# Patient Record
Sex: Female | Born: 1985 | Hispanic: Yes | Marital: Married | Smoking: Never smoker
Health system: Southern US, Community
[De-identification: ages and names within clinical notes are randomized; demographics above are authoritative.]

## PROBLEM LIST (undated history)

## (undated) DIAGNOSIS — D509 Iron deficiency anemia, unspecified: Secondary | ICD-10-CM

## (undated) HISTORY — PX: NO PAST SURGERIES: SHX2092

---
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
## 2006-08-10 ENCOUNTER — Ambulatory Visit: Payer: Self-pay | Admitting: Gastroenterology

## 2007-03-01 ENCOUNTER — Ambulatory Visit: Payer: Self-pay | Admitting: Family Medicine

## 2007-08-09 ENCOUNTER — Observation Stay: Payer: Self-pay

## 2007-08-10 ENCOUNTER — Inpatient Hospital Stay: Payer: Self-pay

## 2007-10-19 ENCOUNTER — Emergency Department: Payer: Self-pay | Admitting: Emergency Medicine

## 2010-01-07 ENCOUNTER — Emergency Department: Payer: Self-pay | Admitting: Unknown Physician Specialty
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
## 2017-11-10 ENCOUNTER — Ambulatory Visit: Payer: Self-pay | Admitting: Family Medicine

## 2018-06-01 NOTE — L&D Delivery Note (Signed)
Delivery Note  Date of delivery: 06/01/2019 Estimated Date of Delivery: 06/05/19 Patient's last menstrual period was 06/28/2018 (exact date). EGA: [redacted]w[redacted]d  Delivery Note At 10:16 AM a viable female was delivered via Vaginal, Spontaneous (Presentation: OA to ROA).  APGAR: 8,9 ; weight 7lbs 7.2oz .   Placenta status: Spontaneous, Intact.  Cord: 3 vessels with the following complications: Velamentous cord insertion.  Cord pH: N/A  Anesthesia: None Episiotomy: None Lacerations: None Suture Repair: None Est. Blood Loss (mL):   50  Mom to postpartum.  Baby to Couplet care / Skin to Skin.  Minda Meo 06/01/2019, 10:34 AM  First Stage:  Labor onset: 0800 Augmentation : AROM Analgesia Lorriane Shire intrapartum: IV medication AROM at [facility] presented to L&D with regular contractions. She was expectantly managed. IV medications were given for pain relief.   Second Stage: Complete dilation at 1008 Onset of pushing at 1010 FHR second stage 130 baseline, mod variability, accelerations present Delivery at 1016 on 06/01/2019  She progressed to complete and had a spontaneous vaginal birth of a live female over an intact perineum in hands and knees. The fetal head was delivered in OA position with restitution to ROA. No nuchal cord. Anterior then posterior shoulders delivered spontaneously. Baby was passed to mother, and assistance given to help hold infant.  Mother and infant were assisted from hands and knees to low fowlers. Infant was attended to by transition RN.  Cord doubly clamped and cut when pulseless and cut by father of baby. Cord blood was not obtained (mother B pos).  Cord blood collected for donation.No  Third Stage: Placenta delivered schultz side, intact with 3VC at 1022 Placenta disposition: to pathology Uterine tone firm / bleeding small to moderate IV pitocin given for hemorrhage prophylaxis  No laceration identified  Anesthesia for repair: N/A Repair None Est.  Blood Loss (mL): 50IB  Complications: None  Mom to postpartum.  Baby to Couplet care / Skin to Skin.  Newborn: Birth Weight: 7lbs 7.2oz  Apgar Scores: 8/9 Feeding planned: Breast   Minda Meo, CNM 06/01/2019 10:34 AM

## 2018-07-30 LAB — OB RESULTS CONSOLE VARICELLA ZOSTER ANTIBODY, IGG: Varicella: IMMUNE

## 2018-08-26 ENCOUNTER — Emergency Department
Admission: EM | Admit: 2018-08-26 | Discharge: 2018-08-26 | Disposition: A | Discharge status: Home / Self Care | Payer: Medicaid Other | Attending: Emergency Medicine | Admitting: Emergency Medicine

## 2018-08-26 ENCOUNTER — Other Ambulatory Visit: Payer: Self-pay

## 2018-08-26 ENCOUNTER — Emergency Department: Payer: Medicaid Other

## 2018-08-26 ENCOUNTER — Encounter: Payer: Self-pay | Admitting: Intensive Care

## 2018-08-26 DIAGNOSIS — R1031 Right lower quadrant pain: Secondary | ICD-10-CM | POA: Diagnosis not present

## 2018-08-26 DIAGNOSIS — O209 Hemorrhage in early pregnancy, unspecified: Secondary | ICD-10-CM | POA: Diagnosis not present

## 2018-08-26 DIAGNOSIS — O9989 Other specified diseases and conditions complicating pregnancy, childbirth and the puerperium: Secondary | ICD-10-CM | POA: Insufficient documentation

## 2018-08-26 DIAGNOSIS — R102 Pelvic and perineal pain: Secondary | ICD-10-CM | POA: Insufficient documentation

## 2018-08-26 DIAGNOSIS — K625 Hemorrhage of anus and rectum: Secondary | ICD-10-CM | POA: Insufficient documentation

## 2018-08-26 DIAGNOSIS — Z3A01 Less than 8 weeks gestation of pregnancy: Secondary | ICD-10-CM | POA: Insufficient documentation

## 2018-08-26 DIAGNOSIS — N939 Abnormal uterine and vaginal bleeding, unspecified: Secondary | ICD-10-CM

## 2018-08-26 LAB — URINALYSIS, COMPLETE (UACMP) WITH MICROSCOPIC
BACTERIA UA: NONE SEEN
RBC / HPF: >50 RBC/hpf — ABNORMAL HIGH (ref 0–5)
Specific Gravity, Urine: 1.023 (ref 1.005–1.030)

## 2018-08-26 LAB — CBC
HEMATOCRIT: 34.7 % — AB (ref 36.0–46.0)
Hemoglobin: 11.1 g/dL — ABNORMAL LOW (ref 12.0–15.0)
MCH: 28 pg (ref 26.0–34.0)
MCHC: 32 g/dL (ref 30.0–36.0)
MCV: 87.4 fL (ref 80.0–100.0)
Platelets: 366 10*3/uL (ref 150–400)
RBC: 3.97 MIL/uL (ref 3.87–5.11)
RDW: 13.9 % (ref 11.5–15.5)
WBC: 8.7 10*3/uL (ref 4.0–10.5)
nRBC: 0 % (ref 0.0–0.2)

## 2018-08-26 LAB — COMPREHENSIVE METABOLIC PANEL
ALT: 15 U/L (ref 0–44)
AST: 17 U/L (ref 15–41)
Albumin: 4 g/dL (ref 3.5–5.0)
Alkaline Phosphatase: 73 U/L (ref 38–126)
Anion gap: 11 (ref 5–15)
BUN: 12 mg/dL (ref 6–20)
CO2: 25 mmol/L (ref 22–32)
Calcium: 8.8 mg/dL — ABNORMAL LOW (ref 8.9–10.3)
Chloride: 104 mmol/L (ref 98–111)
Creatinine, Ser: 0.72 mg/dL (ref 0.44–1.00)
GFR calc Af Amer: >60 mL/min (ref >60)
GFR calc non Af Amer: >60 mL/min (ref >60)
Glucose, Bld: 98 mg/dL (ref 70–99)
Potassium: 3.3 mmol/L — ABNORMAL LOW (ref 3.5–5.1)
Sodium: 140 mmol/L (ref 135–145)
Total Bilirubin: 1 mg/dL (ref 0.3–1.2)
Total Protein: 7.9 g/dL (ref 6.5–8.1)

## 2018-08-26 LAB — HCG, QUANTITATIVE, PREGNANCY: hCG, Beta Chain, Quant, S: <1 m[IU]/mL (ref <5)

## 2018-08-26 LAB — LIPASE, BLOOD: Lipase: 39 U/L (ref 11–51)

## 2018-08-26 LAB — POCT PREGNANCY, URINE: Preg Test, Ur: NEGATIVE

## 2018-08-26 MED ORDER — KETOROLAC TROMETHAMINE 60 MG/2ML IM SOLN
60.0000 mg | Freq: Once | INTRAMUSCULAR | Status: AC
  Administered 2018-08-26: 60 mg via INTRAMUSCULAR
  Filled 2018-08-26: qty 2

## 2018-08-26 NOTE — ED Triage Notes (Signed)
Presents with Sharp pains in lower abd since Thursday night. Patient is [redacted] weeks pregnant. Patient reports heavy bleeding off and on since this AM. Has filled up two pads today

## 2018-08-26 NOTE — ED Provider Notes (Signed)
[facility] Emergency Department Provider Note  Time seen: 5:53 PM  I have reviewed the triage vital signs and the nursing notes.   HISTORY  Chief Complaint Abdominal Pain   HPI Floree Litwak is a 33 y.o. female with no significant past medical history G3, P2 presents to the emergency department approximately [redacted] weeks pregnant, states last menstrual period was the end of January, with vaginal bleeding lower abdominal pain.  According to the patient since early this morning she has been experiencing lower abdominal pain described more as a cramping sensation across the entire lower abdomen, moderate in severity.  States vaginal bleeding since this morning as well she is gone through 2 pads today.  Patient states at the beginning of this month she took 4 home pregnancy test that were all positive, had called trying to get in with an OB but had not been successful in getting in with an OB yet.  Largely negative review of systems otherwise.   History reviewed. No pertinent past medical history.  There are no active problems to display for this patient.   History reviewed. No pertinent surgical history.  Prior to Admission medications   Not on File    No Known Allergies  History reviewed. No pertinent family history.  Social History Social History   Tobacco Use  . Smoking status: Never Smoker  . Smokeless tobacco: Never Used  Substance Use Topics  . Alcohol use: Not Currently  . Drug use: Never    Review of Systems Constitutional: Negative for fever. ENT: Negative for recent illness/congestion Cardiovascular: Negative for chest pain. Respiratory: Negative for shortness of breath. Gastrointestinal: Moderate lower abdominal pain/cramping.  Negative for vomiting or diarrhea.  Patient does state occasional bright red blood in her stool. Genitourinary: Positive for vaginal bleeding since this morning. Musculoskeletal: Negative for musculoskeletal  complaints Skin: Negative for skin complaints  Neurological: Negative for headache All other ROS negative  ____________________________________________   PHYSICAL EXAM:  Constitutional: Alert and oriented. Well appearing and in no distress. Eyes: Normal exam ENT   Head: Normocephalic and atraumatic.   Mouth/Throat: Mucous membranes are moist. Cardiovascular: Normal rate, regular rhythm.  Respiratory: Normal respiratory effort without tachypnea nor retractions. Breath sounds are clear  Gastrointestinal: Soft, mild suprapubic tenderness.  No rebound guarding or distention. Musculoskeletal: Nontender with normal range of motion in all extremities.  Neurologic:  Normal speech and language. No gross focal neurologic deficits  Skin:  Skin is warm, dry and intact.  Psychiatric: Mood and affect are normal.   ____________________________________________   RADIOLOGY  IMPRESSION: 1. No significant sonographic abnormality of the uterus, ovaries, or regional pelvis to explain the patient's symptoms.  ____________________________________________   INITIAL IMPRESSION / ASSESSMENT AND PLAN / ED COURSE  Pertinent labs & imaging results that were available during my care of the patient were reviewed by me and considered in my medical decision making (see chart for details).  Patient presents to the emergency department for lower abdominal discomfort and vaginal bleeding, approximately [redacted] weeks pregnant per patient.  Differential this time would include miscarriage, threatened miscarriage, ectopic pregnancy, dysfunctional uterine bleeding, subchorionic hemorrhage.  We will check labs, perform a pelvic examination and likely proceed with ultrasound.  Patient agreeable to plan of care.  Patient's pelvic exam shows a mild amount of bleeding, no products present in the cervix.  Patient's rectal examination shows light brown stool however very slightly guaiac positive, no hemorrhoids noted.   Patient's ultrasound has resulted showing overall normal results.  Beta-hCG is negative.  Possibly completed miscarriage vs dysfunctional uterine bleeding.  We will discharge patient from the emergency department with PCP follow-up.  We will also refer to GI medicine given slightly positive stool.  ____________________________________________   FINAL CLINICAL IMPRESSION(S) / ED DIAGNOSES  Rectal bleeding Vaginal bleeding   Minna Antis, MD 08/26/18 2009

## 2018-08-26 NOTE — ED Notes (Signed)
Patient transported to Ultrasound 

## 2018-08-28 LAB — OB RESULTS CONSOLE HIV ANTIBODY (ROUTINE TESTING): HIV: NONREACTIVE

## 2018-08-28 LAB — OB RESULTS CONSOLE ABO/RH

## 2018-08-29 LAB — OB RESULTS CONSOLE RUBELLA ANTIBODY, IGM: Rubella: IMMUNE

## 2018-08-29 LAB — OB RESULTS CONSOLE ABO/RH: RH Type: POSITIVE

## 2018-08-29 LAB — OB RESULTS CONSOLE RPR: RPR: REACTIVE

## 2018-11-24 LAB — OB RESULTS CONSOLE VARICELLA ZOSTER ANTIBODY, IGG: Varicella: IMMUNE

## 2018-11-24 LAB — OB RESULTS CONSOLE HEPATITIS B SURFACE ANTIGEN: Hepatitis B Surface Ag: NEGATIVE

## 2018-11-24 LAB — OB RESULTS CONSOLE RPR: RPR: REACTIVE

## 2018-11-24 LAB — OB RESULTS CONSOLE RUBELLA ANTIBODY, IGM: Rubella: IMMUNE

## 2019-02-07 ENCOUNTER — Other Ambulatory Visit: Payer: Self-pay

## 2019-02-07 DIAGNOSIS — Z20822 Contact with and (suspected) exposure to covid-19: Secondary | ICD-10-CM

## 2019-02-09 LAB — NOVEL CORONAVIRUS, NAA: SARS-CoV-2, NAA: NOT DETECTED

## 2019-03-29 ENCOUNTER — Other Ambulatory Visit: Payer: Self-pay

## 2019-03-29 ENCOUNTER — Inpatient Hospital Stay: Payer: Medicaid Other | Admitting: Oncology

## 2019-03-29 ENCOUNTER — Encounter: Payer: Self-pay | Admitting: Oncology

## 2019-03-29 ENCOUNTER — Inpatient Hospital Stay: Payer: Medicaid Other | Attending: Oncology | Admitting: Oncology

## 2019-03-29 VITALS — BP 108/74 | HR 75 | Temp 97.7°F | Resp 18 | Ht 61.0 in | Wt 187.5 lb

## 2019-03-29 DIAGNOSIS — R200 Anesthesia of skin: Secondary | ICD-10-CM

## 2019-03-29 DIAGNOSIS — D508 Other iron deficiency anemias: Secondary | ICD-10-CM

## 2019-03-29 DIAGNOSIS — R5383 Other fatigue: Secondary | ICD-10-CM | POA: Insufficient documentation

## 2019-03-29 DIAGNOSIS — O99013 Anemia complicating pregnancy, third trimester: Secondary | ICD-10-CM | POA: Insufficient documentation

## 2019-03-29 DIAGNOSIS — D518 Other vitamin B12 deficiency anemias: Secondary | ICD-10-CM | POA: Diagnosis not present

## 2019-03-29 DIAGNOSIS — D649 Anemia, unspecified: Secondary | ICD-10-CM

## 2019-03-29 LAB — TECHNOLOGIST SMEAR REVIEW: Plt Morphology: ADEQUATE

## 2019-03-29 LAB — COMPREHENSIVE METABOLIC PANEL
ALT: 11 U/L (ref 0–44)
AST: 13 U/L — ABNORMAL LOW (ref 15–41)
Albumin: 2.6 g/dL — ABNORMAL LOW (ref 3.5–5.0)
Alkaline Phosphatase: 115 U/L (ref 38–126)
Anion gap: 8 (ref 5–15)
BUN: 7 mg/dL (ref 6–20)
CO2: 23 mmol/L (ref 22–32)
Calcium: 8.5 mg/dL — ABNORMAL LOW (ref 8.9–10.3)
Chloride: 104 mmol/L (ref 98–111)
Creatinine, Ser: 0.64 mg/dL (ref 0.44–1.00)
GFR calc Af Amer: >60 mL/min (ref >60)
GFR calc non Af Amer: >60 mL/min (ref >60)
Glucose, Bld: 104 mg/dL — ABNORMAL HIGH (ref 70–99)
Potassium: 3.6 mmol/L (ref 3.5–5.1)
Sodium: 135 mmol/L (ref 135–145)
Total Bilirubin: 0.5 mg/dL (ref 0.3–1.2)
Total Protein: 6.7 g/dL (ref 6.5–8.1)

## 2019-03-29 LAB — IRON AND TIBC
Iron: 36 ug/dL (ref 28–170)
Saturation Ratios: 6 % — ABNORMAL LOW (ref 10.4–31.8)
TIBC: 565 ug/dL — ABNORMAL HIGH (ref 250–450)
UIBC: 530 ug/dL

## 2019-03-29 LAB — CBC WITH DIFFERENTIAL/PLATELET
Abs Immature Granulocytes: 0.04 10*3/uL (ref 0.00–0.07)
Basophils Absolute: 0 10*3/uL (ref 0.0–0.1)
Basophils Relative: 0 %
Eosinophils Absolute: 0 10*3/uL (ref 0.0–0.5)
Eosinophils Relative: 1 %
HCT: 25.3 % — ABNORMAL LOW (ref 36.0–46.0)
Hemoglobin: 8.2 g/dL — ABNORMAL LOW (ref 12.0–15.0)
Immature Granulocytes: 1 %
Lymphocytes Relative: 18 %
Lymphs Abs: 1.3 10*3/uL (ref 0.7–4.0)
MCH: 28.6 pg (ref 26.0–34.0)
MCHC: 32.4 g/dL (ref 30.0–36.0)
MCV: 88.2 fL (ref 80.0–100.0)
Monocytes Absolute: 0.3 10*3/uL (ref 0.1–1.0)
Monocytes Relative: 4 %
Neutro Abs: 5.7 10*3/uL (ref 1.7–7.7)
Neutrophils Relative %: 76 %
Platelets: 298 10*3/uL (ref 150–400)
RBC: 2.87 MIL/uL — ABNORMAL LOW (ref 3.87–5.11)
RDW: 14.5 % (ref 11.5–15.5)
WBC: 7.4 10*3/uL (ref 4.0–10.5)
nRBC: 0 % (ref 0.0–0.2)

## 2019-03-29 LAB — FOLATE: Folate: 9.4 ng/mL (ref >5.9)

## 2019-03-29 LAB — VITAMIN B12: Vitamin B-12: 149 pg/mL — ABNORMAL LOW (ref 180–914)

## 2019-03-29 LAB — RETIC PANEL
Immature Retic Fract: 26.6 % — ABNORMAL HIGH (ref 2.3–15.9)
RBC.: 2.87 MIL/uL — ABNORMAL LOW (ref 3.87–5.11)
Retic Count, Absolute: 53.7 10*3/uL (ref 19.0–186.0)
Retic Ct Pct: 1.9 % (ref 0.4–3.1)
Reticulocyte Hemoglobin: 30.3 pg (ref >27.9)

## 2019-03-29 LAB — FERRITIN: Ferritin: 5 ng/mL — ABNORMAL LOW (ref 11–307)

## 2019-03-29 LAB — TSH: TSH: 1.469 u[IU]/mL (ref 0.350–4.500)

## 2019-03-29 NOTE — Progress Notes (Signed)
Patient here to establish care. States she feels fatigue most of the time. When she stands for a long time, her left leg feels numb.

## 2019-04-01 ENCOUNTER — Encounter: Payer: Self-pay | Admitting: Oncology

## 2019-04-01 DIAGNOSIS — D509 Iron deficiency anemia, unspecified: Secondary | ICD-10-CM

## 2019-04-01 HISTORY — DX: Iron deficiency anemia, unspecified: D50.9

## 2019-04-01 MED ORDER — VITAMIN B-12 1000 MCG PO TABS: 1000.0000 ug | ORAL_TABLET | Freq: Every day | ORAL | 0 refills | Status: DC

## 2019-04-01 NOTE — Progress Notes (Signed)
Hematology/Oncology Consult note [facility] Telephone:([phone] [phone]   Patient Care Team: Patient, No Pcp Per as PCP - General (General Practice)  REFERRING PROVIDER: Genia DelHaviland, Margaret, CNM  CHIEF COMPLAINTS/REASON FOR VISIT:  Evaluation of anemia  HISTORY OF PRESENTING ILLNESS:  Sonya Melton is a  33 y.o.  female with PMH listed below who was referred to me for evaluation of anemia Reviewed patient's recent labs that was done.  Labs revealed anemia with hemoglobin of 8.8.  WBC 8.2, platelet count 338 Reviewed patient's previous labs, anemia is chronic onset , duration is since March 2020. No aggravating or improving factors.  Associated signs and symptoms: Patient reports fatigue, worsened with exertion.  She feels lower extremity numbness.  Denies SOB with exertion.  Denies weight loss, easy bruising, hematochezia, hemoptysis, hematuria. Patient has history of iron deficiency anemia previously was advised to take oral iron supplementation.  Not tolerating well due to GI side effects. She recalls being iron deficient during her previous pregnancies.  Context: History of GI bleeding: Denies               History of heavy menstrual period.  Yes Patient is pregnant, in third trimester.               Review of Systems  Constitutional: Positive for fatigue. Negative for appetite change, chills and fever.  HENT:   Negative for hearing loss and voice change.   Eyes: Negative for eye problems.  Respiratory: Negative for chest tightness and cough.   Cardiovascular: Negative for chest pain.  Gastrointestinal: Negative for abdominal distention, abdominal pain and blood in stool.  Endocrine: Negative for hot flashes.  Genitourinary: Negative for difficulty urinating and frequency.   Musculoskeletal: Negative for arthralgias.  Skin: Negative for itching and rash.  Neurological: Positive for numbness. Negative for extremity weakness.   Hematological: Negative for adenopathy.  Psychiatric/Behavioral: Negative for confusion.     MEDICAL HISTORY:  History reviewed. No pertinent past medical history. No past medical history SURGICAL HISTORY: History reviewed. No pertinent surgical history. No surgery SOCIAL HISTORY: Social History   Socioeconomic History  . Marital status: Married    Spouse name: Not on file  . Number of children: Not on file  . Years of education: Not on file  . Highest education level: Not on file  Occupational History  . Not on file  Social Needs  . Financial resource strain: Not on file  . Food insecurity    Worry: Not on file    Inability: Not on file  . Transportation needs    Medical: Not on file    Non-medical: Not on file  Tobacco Use  . Smoking status: Never Smoker  . Smokeless tobacco: Never Used  Substance and Sexual Activity  . Alcohol use: Not Currently  . Drug use: Never  . Sexual activity: Yes  Lifestyle  . Physical activity    Days per week: Not on file    Minutes per session: Not on file  . Stress: Not on file  Relationships  . Social Musicianconnections    Talks on phone: Not on file    Gets together: Not on file    Attends religious service: Not on file    Active member of club or organization: Not on file    Attends meetings of clubs or organizations: Not on file    Relationship status: Not on file  . Intimate partner violence    Fear of current or ex partner:  Not on file    Emotionally abused: Not on file    Physically abused: Not on file    Forced sexual activity: Not on file  Other Topics Concern  . Not on file  Social History Narrative  . Not on file    FAMILY HISTORY: Family History  Problem Relation Age of Onset  . Lung disease Brother     ALLERGIES:  has No Known Allergies.  MEDICATIONS:  Current Outpatient Medications  Medication Sig Dispense Refill  . ferrous sulfate 325 (65 FE) MG tablet Take by mouth 2 (two) times daily with a meal.     .  Prenatal 28-0.8 MG TABS Take by mouth.    . vitamin C (ASCORBIC ACID) 500 MG tablet Take 500 mg by mouth daily.     No current facility-administered medications for this visit.      PHYSICAL EXAMINATION: ECOG PERFORMANCE STATUS: 1 - Symptomatic but completely ambulatory Vitals:   03/29/19 1051  BP: 108/74  Pulse: 75  Resp: 18  Temp: 97.7 F (36.5 C)   Filed Weights   03/29/19 1051  Weight: 187 lb 8 oz (85 kg)    Physical Exam Constitutional:      General: She is not in acute distress. HENT:     Head: Normocephalic and atraumatic.  Eyes:     General: No scleral icterus.    Pupils: Pupils are equal, round, and reactive to light.  Neck:     Musculoskeletal: Normal range of motion and neck supple.  Cardiovascular:     Rate and Rhythm: Normal rate and regular rhythm.     Heart sounds: Normal heart sounds.  Pulmonary:     Effort: Pulmonary effort is normal. No respiratory distress.     Breath sounds: No wheezing.  Abdominal:     General: Bowel sounds are normal.     Palpations: Abdomen is soft.     Comments: Gravid uterus  Musculoskeletal: Normal range of motion.        General: No deformity.  Skin:    General: Skin is warm and dry.     Findings: No erythema or rash.  Neurological:     Mental Status: She is alert and oriented to person, place, and time.     Cranial Nerves: No cranial nerve deficit.     Coordination: Coordination normal.  Psychiatric:        Behavior: Behavior normal.        Thought Content: Thought content normal.      LABORATORY DATA:  I have reviewed the data as listed Lab Results  Component Value Date   WBC 7.4 03/29/2019   HGB 8.2 (L) 03/29/2019   HCT 25.3 (L) 03/29/2019   MCV 88.2 03/29/2019   PLT 298 03/29/2019   Recent Labs    08/26/18 1657 03/29/19 1137  NA 140 135  K 3.3* 3.6  CL 104 104  CO2 25 23  GLUCOSE 98 104*  BUN 12 7  CREATININE 0.72 0.64  CALCIUM 8.8* 8.5*  GFRNONAA >60 >60  GFRAA >60 >60  PROT 7.9 6.7   ALBUMIN 4.0 2.6*  AST 17 13*  ALT 15 11  ALKPHOS 73 115  BILITOT 1.0 0.5   Iron/TIBC/Ferritin/ %Sat    Component Value Date/Time   IRON 36 03/29/2019 1137   TIBC 565 (H) 03/29/2019 1137   FERRITIN 5 (L) 03/29/2019 1137   IRONPCTSAT 6 (L) 03/29/2019 1137     RADIOGRAPHIC STUDIES: I have personally reviewed the radiological images  as listed and agreed with the findings in the report. No results found.    ASSESSMENT & PLAN:  1. Other iron deficiency anemia   2. Anemia during pregnancy in third trimester   3. Lower extremity numbness   4. Other vitamin B12 deficiency anemia    Anemia during third trimester pregnancy. Recommend check iron, TIBC, ferritin, reticulocyte panel, TSH, CMP, CBC.  Smear. Lower extremity numbness, questionable sciatic nerve pain treatment we will rule out neuropathy.  Check vitamin B12 level.  We discussed about the possibility of IV iron infusion if  repeat blood work shows severe iron deficiency.  Allergy reactions/infusion reaction including anaphylactic reaction discussed with patient. Other side effects include but not limited to high blood pressure, skin rash, weight gain, leg swelling, etc. Patient voices understanding and willing to proceed. Labs reviewed.  Consistent with severe iron deficiency anemia.Plan IV iron with Venofer 200mg  weekly x 4 doses  #Vitamin B12 deficiency.  Recommend start oral vitamin B12 supplementation 1000 mcg daily.  Repeat in the next visit.  Orders Placed This Encounter  Procedures  . CBC with Differential/Platelet    Standing Status:   Future    Number of Occurrences:   1    Standing Expiration Date:   03/28/2020  . Technologist smear review    Standing Status:   Future    Number of Occurrences:   1    Standing Expiration Date:   03/28/2020  . Iron and TIBC    Standing Status:   Future    Number of Occurrences:   1    Standing Expiration Date:   03/28/2020  . Ferritin    Standing Status:   Future    Number  of Occurrences:   1    Standing Expiration Date:   03/28/2020  . Retic Panel    Standing Status:   Future    Number of Occurrences:   1    Standing Expiration Date:   03/28/2020  . TSH    Standing Status:   Future    Number of Occurrences:   1    Standing Expiration Date:   03/28/2020  . Vitamin B12    Standing Status:   Future    Number of Occurrences:   1    Standing Expiration Date:   03/28/2020  . Folate    Standing Status:   Future    Number of Occurrences:   1    Standing Expiration Date:   03/28/2020  . Comprehensive metabolic panel    Standing Status:   Future    Number of Occurrences:   1    Standing Expiration Date:   03/28/2020    All questions were answered. The patient knows to call the clinic with any problems questions or concerns. Cc10/30/2021, CNM Dr.Beasley  Return of visit: 6 weeks.  Thank you for this kind referral and the opportunity to participate in the care of this patient. A copy of today's note is routed to referring provider    Dear Genia Del and Lucita Ferrara,  I had the pleasure seeing Sonya Melton  for evaluation of  Attached is a copy of today's clinic visit note.    Thank you for this kind referral and the opportunity to involve me in participating in the care of this patient. Please do not hesitate to call me if any questions or concerns.    Sonya Salvage MD PhD [facility]  04/01/2019

## 2019-04-01 NOTE — Progress Notes (Signed)
Please arrange IV Venofer weekly x 4.  Follow up lab in 8 weeks, 1-2 days prior to MD +/- venofer.  Thanks.     Dear Sonya Melton,  You blood work is consistent with persistent sever iron deficiency anemia. As we discussed during your visit, IV iron infusions is recommended.  You will be contacted for weekly IV iron infusion for 4 treatments.  You vitamin B12 level is low too and I recommend you start to take oral vitamin B12 supplementation.  Prescription was sent to [facility].  You will be contact for repeat blood work and see me in 8 weeks.  Take care.   Dr.Finn Altemose

## 2019-04-03 ENCOUNTER — Telehealth: Payer: Self-pay

## 2019-04-03 NOTE — Telephone Encounter (Signed)
-----   Message from Earlie Server, MD sent at 04/01/2019  1:31 PM EDT ----- Please arrange IV Venofer weekly x 4.  Follow up lab in 8 weeks, 1-2 days prior to MD +/- venofer.  Thanks.     Dear Mrs.Wile,  You blood work is consistent with persistent sever iron deficiency anemia. As we discussed during your visit, IV iron infusions is recommended.  You will be contacted for weekly IV iron infusion for 4 treatments.  You vitamin B12 level is low too and I recommend you start to take oral vitamin B12 supplementation.  Prescription was sent to [facility].  You will be contact for repeat blood work and see me in 8 weeks.  Take care.   Dr.Yu

## 2019-04-07 ENCOUNTER — Other Ambulatory Visit: Payer: Self-pay

## 2019-04-07 ENCOUNTER — Inpatient Hospital Stay: Payer: Medicaid Other | Attending: Oncology

## 2019-04-07 VITALS — BP 100/64 | HR 77 | Temp 97.5°F | Resp 18

## 2019-04-07 DIAGNOSIS — O99013 Anemia complicating pregnancy, third trimester: Secondary | ICD-10-CM | POA: Insufficient documentation

## 2019-04-07 DIAGNOSIS — D508 Other iron deficiency anemias: Secondary | ICD-10-CM

## 2019-04-07 MED ORDER — IRON SUCROSE 20 MG/ML IV SOLN
200.0000 mg | Freq: Once | INTRAVENOUS | Status: AC
  Administered 2019-04-07: 200 mg via INTRAVENOUS
  Filled 2019-04-07: qty 10

## 2019-04-07 MED ORDER — SODIUM CHLORIDE 0.9 % IV SOLN: 200.0000 mg | Freq: Once | INTRAVENOUS | Status: DC

## 2019-04-07 MED ORDER — SODIUM CHLORIDE 0.9 % IV SOLN
Freq: Once | INTRAVENOUS | Status: AC
  Administered 2019-04-07: 14:00:00 via INTRAVENOUS
  Filled 2019-04-07: qty 250

## 2019-04-14 ENCOUNTER — Other Ambulatory Visit: Payer: Self-pay

## 2019-04-14 ENCOUNTER — Inpatient Hospital Stay: Payer: Medicaid Other

## 2019-04-14 VITALS — BP 100/64 | HR 77 | Temp 97.2°F | Resp 18

## 2019-04-14 DIAGNOSIS — D508 Other iron deficiency anemias: Secondary | ICD-10-CM

## 2019-04-14 DIAGNOSIS — O99013 Anemia complicating pregnancy, third trimester: Secondary | ICD-10-CM | POA: Diagnosis not present

## 2019-04-14 MED ORDER — SODIUM CHLORIDE 0.9 % IV SOLN
Freq: Once | INTRAVENOUS | Status: AC
  Administered 2019-04-14: 14:00:00 via INTRAVENOUS
  Filled 2019-04-14: qty 250

## 2019-04-14 MED ORDER — SODIUM CHLORIDE 0.9 % IV SOLN: 200.0000 mg | Freq: Once | INTRAVENOUS | Status: DC

## 2019-04-14 MED ORDER — IRON SUCROSE 20 MG/ML IV SOLN
200.0000 mg | Freq: Once | INTRAVENOUS | Status: AC
  Administered 2019-04-14: 200 mg via INTRAVENOUS
  Filled 2019-04-14: qty 10

## 2019-04-21 ENCOUNTER — Other Ambulatory Visit: Payer: Self-pay

## 2019-04-21 ENCOUNTER — Inpatient Hospital Stay: Payer: Medicaid Other

## 2019-04-21 VITALS — BP 110/68 | HR 71 | Temp 98.6°F | Resp 18

## 2019-04-21 DIAGNOSIS — D508 Other iron deficiency anemias: Secondary | ICD-10-CM

## 2019-04-21 DIAGNOSIS — O99013 Anemia complicating pregnancy, third trimester: Secondary | ICD-10-CM | POA: Diagnosis not present

## 2019-04-21 MED ORDER — SODIUM CHLORIDE 0.9 % IV SOLN: 200.0000 mg | Freq: Once | INTRAVENOUS | Status: DC

## 2019-04-21 MED ORDER — IRON SUCROSE 20 MG/ML IV SOLN
200.0000 mg | Freq: Once | INTRAVENOUS | Status: AC
  Administered 2019-04-21: 200 mg via INTRAVENOUS
  Filled 2019-04-21: qty 10

## 2019-04-21 MED ORDER — SODIUM CHLORIDE 0.9 % IV SOLN
Freq: Once | INTRAVENOUS | Status: AC
  Administered 2019-04-21: 14:00:00 via INTRAVENOUS
  Filled 2019-04-21: qty 250

## 2019-05-01 ENCOUNTER — Inpatient Hospital Stay: Payer: Medicaid Other

## 2019-05-01 ENCOUNTER — Other Ambulatory Visit: Payer: Self-pay

## 2019-05-01 VITALS — BP 102/70 | HR 89 | Temp 97.2°F | Resp 18

## 2019-05-01 DIAGNOSIS — D508 Other iron deficiency anemias: Secondary | ICD-10-CM

## 2019-05-01 DIAGNOSIS — O99013 Anemia complicating pregnancy, third trimester: Secondary | ICD-10-CM | POA: Diagnosis not present

## 2019-05-01 MED ORDER — IRON SUCROSE 20 MG/ML IV SOLN
200.0000 mg | Freq: Once | INTRAVENOUS | Status: AC
  Administered 2019-05-01: 200 mg via INTRAVENOUS
  Filled 2019-05-01: qty 10

## 2019-05-01 MED ORDER — SODIUM CHLORIDE 0.9 % IV SOLN: 200.0000 mg | Freq: Once | INTRAVENOUS | Status: DC

## 2019-05-01 MED ORDER — SODIUM CHLORIDE 0.9 % IV SOLN
Freq: Once | INTRAVENOUS | Status: AC
  Administered 2019-05-01: 13:00:00 via INTRAVENOUS
  Filled 2019-05-01: qty 250

## 2019-05-12 LAB — OB RESULTS CONSOLE RPR: RPR: REACTIVE

## 2019-05-17 LAB — OB RESULTS CONSOLE GC/CHLAMYDIA
Chlamydia: NEGATIVE
Gonorrhea: NEGATIVE

## 2019-05-17 LAB — OB RESULTS CONSOLE GBS: GBS: POSITIVE

## 2019-05-18 LAB — OB RESULTS CONSOLE GBS: GBS: POSITIVE

## 2019-05-20 ENCOUNTER — Other Ambulatory Visit: Payer: Self-pay

## 2019-05-20 ENCOUNTER — Encounter: Payer: Self-pay | Admitting: Obstetrics and Gynecology

## 2019-05-20 ENCOUNTER — Observation Stay
Admission: EM | Admit: 2019-05-20 | Discharge: 2019-05-20 | Disposition: A | Discharge status: Home / Self Care | Payer: Medicaid Other

## 2019-05-20 DIAGNOSIS — Z79899 Other long term (current) drug therapy: Secondary | ICD-10-CM | POA: Diagnosis not present

## 2019-05-20 DIAGNOSIS — Z3A37 37 weeks gestation of pregnancy: Secondary | ICD-10-CM | POA: Diagnosis not present

## 2019-05-20 DIAGNOSIS — Z836 Family history of other diseases of the respiratory system: Secondary | ICD-10-CM | POA: Diagnosis not present

## 2019-05-20 DIAGNOSIS — O479 False labor, unspecified: Secondary | ICD-10-CM | POA: Diagnosis present

## 2019-05-20 DIAGNOSIS — O212 Late vomiting of pregnancy: Secondary | ICD-10-CM | POA: Diagnosis not present

## 2019-05-20 DIAGNOSIS — O26893 Other specified pregnancy related conditions, third trimester: Secondary | ICD-10-CM | POA: Diagnosis not present

## 2019-05-20 DIAGNOSIS — Z3689 Encounter for other specified antenatal screening: Secondary | ICD-10-CM | POA: Insufficient documentation

## 2019-05-20 LAB — COMPREHENSIVE METABOLIC PANEL
ALT: 8 U/L (ref 0–44)
AST: 15 U/L (ref 15–41)
Albumin: 2.8 g/dL — ABNORMAL LOW (ref 3.5–5.0)
Alkaline Phosphatase: 144 U/L — ABNORMAL HIGH (ref 38–126)
Anion gap: 11 (ref 5–15)
BUN: 10 mg/dL (ref 6–20)
CO2: 20 mmol/L — ABNORMAL LOW (ref 22–32)
Calcium: 9.1 mg/dL (ref 8.9–10.3)
Chloride: 104 mmol/L (ref 98–111)
Creatinine, Ser: 0.6 mg/dL (ref 0.44–1.00)
GFR calc Af Amer: >60 mL/min (ref >60)
GFR calc non Af Amer: >60 mL/min (ref >60)
Glucose, Bld: 88 mg/dL (ref 70–99)
Potassium: 4.1 mmol/L (ref 3.5–5.1)
Sodium: 135 mmol/L (ref 135–145)
Total Bilirubin: 1.1 mg/dL (ref 0.3–1.2)
Total Protein: 6.9 g/dL (ref 6.5–8.1)

## 2019-05-20 LAB — URINALYSIS, COMPLETE (UACMP) WITH MICROSCOPIC
Bilirubin Urine: NEGATIVE
Glucose, UA: NEGATIVE mg/dL
Hgb urine dipstick: NEGATIVE
Ketones, ur: 5 mg/dL — AB
Leukocytes,Ua: NEGATIVE
Nitrite: NEGATIVE
Protein, ur: 30 mg/dL — AB
Specific Gravity, Urine: 1.016 (ref 1.005–1.030)
pH: 6 (ref 5.0–8.0)

## 2019-05-20 LAB — PROTEIN / CREATININE RATIO, URINE
Creatinine, Urine: 172 mg/dL
Protein Creatinine Ratio: 0.3 mg/mg{Cre} — ABNORMAL HIGH (ref 0.00–0.15)
Total Protein, Urine: 51 mg/dL

## 2019-05-20 LAB — CBC
HCT: 31.4 % — ABNORMAL LOW (ref 36.0–46.0)
Hemoglobin: 10.3 g/dL — ABNORMAL LOW (ref 12.0–15.0)
MCH: 29.3 pg (ref 26.0–34.0)
MCHC: 32.8 g/dL (ref 30.0–36.0)
MCV: 89.2 fL (ref 80.0–100.0)
Platelets: 230 10*3/uL (ref 150–400)
RBC: 3.52 MIL/uL — ABNORMAL LOW (ref 3.87–5.11)
RDW: 19.2 % — ABNORMAL HIGH (ref 11.5–15.5)
WBC: 9.6 10*3/uL (ref 4.0–10.5)
nRBC: 0 % (ref 0.0–0.2)

## 2019-05-20 MED ORDER — ACETAMINOPHEN 500 MG PO TABS
1000.0000 mg | ORAL_TABLET | Freq: Once | ORAL | Status: AC
  Administered 2019-05-20: 14:00:00 1000 mg via ORAL
  Filled 2019-05-20: qty 2

## 2019-05-20 NOTE — Discharge Summary (Signed)
Sonya Melton is a 33 y.o. female. She is at [redacted]w[redacted]d gestation. Patient's last menstrual period was 06/28/2018 (exact date). Estimated Date of Delivery: 06/05/19  Prenatal care site: [facility]   Current pregnancy complicated by:  1. Reactive RPR- Treponema Neg 2. Anemia- seen by Heme 3. A1C 5.7, obesity, normal 3hr GTT  Chief complaint: painful UCs since 0700  Location: low back and low abdominal pain Onset/timing: started after breakfast this am, vomited and then started having some pain.  Duration: intermittent.  Quality: cramping Severity: n/a Aggravating or alleviating conditions: none, cramping worse when laying in bed.  Associated signs/symptoms: denies dysuria. No HA, VD or RUQ pain.  Context: none  S: Resting comfortably. no CTX, no VB.no LOF,  Active fetal movement.  Denies: HA, visual changes, SOB, or RUQ/epigastric pain  Maternal Medical History:   Past Medical History:  Diagnosis Date  . IDA (iron deficiency anemia) 04/01/2019  . IDA (iron deficiency anemia) 04/01/2019    Past Surgical History:  Procedure Laterality Date  . NO PAST SURGERIES      No Known Allergies  Prior to Admission medications   Medication Sig Start Date End Date Taking? Authorizing Provider  ferrous sulfate 325 (65 FE) MG tablet Take by mouth 2 (two) times daily with a meal.  03/17/19  Yes [provider]  Prenatal 28-0.8 MG TABS Take by mouth.   Yes [provider]  vitamin B-12 (CYANOCOBALAMIN) 1000 MCG tablet Take 1 tablet (1,000 mcg total) by mouth daily. 04/01/19  Yes Rickard Patience, MD  vitamin C (ASCORBIC ACID) 500 MG tablet Take 500 mg by mouth daily.   Yes [provider]      Social History: She  reports that she has never smoked. She has never used smokeless tobacco. She reports previous alcohol use. She reports that she does not use drugs.  Family History: family history includes Lung disease in her brother.   Review of Systems: A full  review of systems was performed and negative except as noted in the HPI.     O:  BP 134/87   Pulse 75   Temp 98.7 F (37.1 C) (Oral)   Resp 18   Ht 5\' 1"  (1.549 m)   Wt 88.5 kg   LMP 06/28/2018 (Exact Date)   SpO2 94%   BMI 36.84 kg/m  Results for orders placed or performed during the hospital encounter of 05/20/19 (from the past 48 hour(s))  Urinalysis, Complete w Microscopic   Collection Time: 05/20/19 11:18 AM  Result Value Ref Range   Color, Urine YELLOW (A) YELLOW   APPearance HAZY (A) CLEAR   Specific Gravity, Urine 1.016 1.005 - 1.030   pH 6.0 5.0 - 8.0   Glucose, UA NEGATIVE NEGATIVE mg/dL   Hgb urine dipstick NEGATIVE NEGATIVE   Bilirubin Urine NEGATIVE NEGATIVE   Ketones, ur 5 (A) NEGATIVE mg/dL   Protein, ur 30 (A) NEGATIVE mg/dL   Nitrite NEGATIVE NEGATIVE   Leukocytes,Ua NEGATIVE NEGATIVE   RBC / HPF 0-5 0 - 5 RBC/hpf   WBC, UA 0-5 0 - 5 WBC/hpf   Bacteria, UA FEW (A) NONE SEEN   Squamous Epithelial / LPF 11-20 0 - 5   Mucus PRESENT   Protein / creatinine ratio, urine   Collection Time: 05/20/19 11:18 AM  Result Value Ref Range   Creatinine, Urine 172 mg/dL   Total Protein, Urine 51 mg/dL   Protein Creatinine Ratio 0.30 (H) 0.00 - 0.15 mg/mg[Cre]  CBC  Collection Time: 05/20/19 12:29 PM  Result Value Ref Range   WBC 9.6 4.0 - 10.5 K/uL   RBC 3.52 (L) 3.87 - 5.11 MIL/uL   Hemoglobin 10.3 (L) 12.0 - 15.0 g/dL   HCT 31.4 (L) 36.0 - 46.0 %   MCV 89.2 80.0 - 100.0 fL   MCH 29.3 26.0 - 34.0 pg   MCHC 32.8 30.0 - 36.0 g/dL   RDW 19.2 (H) 11.5 - 15.5 %   Platelets 230 150 - 400 K/uL   nRBC 0.0 0.0 - 0.2 %  Comprehensive metabolic panel   Collection Time: 05/20/19 12:29 PM  Result Value Ref Range   Sodium 135 135 - 145 mmol/L   Potassium 4.1 3.5 - 5.1 mmol/L   Chloride 104 98 - 111 mmol/L   CO2 20 (L) 22 - 32 mmol/L   Glucose, Bld 88 70 - 99 mg/dL   BUN 10 6 - 20 mg/dL   Creatinine, Ser 0.60 0.44 - 1.00 mg/dL   Calcium 9.1 8.9 - 10.3 mg/dL   Total  Protein 6.9 6.5 - 8.1 g/dL   Albumin 2.8 (L) 3.5 - 5.0 g/dL   AST 15 15 - 41 U/L   ALT 8 0 - 44 U/L   Alkaline Phosphatase 144 (H) 38 - 126 U/L   Total Bilirubin 1.1 0.3 - 1.2 mg/dL   GFR calc non Af Amer >60 >60 mL/min   GFR calc Af Amer >60 >60 mL/min   Anion gap 11 5 - 15    Single elevated BP noted taken by ER on initial presentation.   Constitutional: NAD, AAOx3  HE/ENT: extraocular movements grossly intact, moist mucous membranes CV: RRR PULM: nl respiratory effort, CTABL     Abd: gravid, non-tender, non-distended, soft      Ext: Non-tender, Nonedematous   Psych: mood appropriate, speech normal Pelvic: last SVE 2/50/-2, soft/midposition, scant bloody show.   Fetal  monitoring: Cat I Appropriate for GA, reactive NST Baseline: 140bpm Variability: moderate Accelerations:  present x >2 Decelerations absent TOCO: q2-6min, palp mild.     A/P: 33 y.o. [redacted]w[redacted]d here for antenatal surveillance for contractions- false labor  Principle Diagnosis:  elevated BP without dx HTN, early/latent labor at 37.5wks   Active labor: not present.   Fetal Wellbeing: Reassuring Cat 1 tracing with Reactive NST   Single elevated BP, normal CBC, CMP, elevated P/C ratio.   D/c home stable, precautions reviewed, follow-up as scheduled.    Francetta Found, CNM 05/20/2019  3:44 PM

## 2019-05-24 ENCOUNTER — Other Ambulatory Visit: Payer: Self-pay

## 2019-05-24 ENCOUNTER — Encounter: Payer: Self-pay | Admitting: Obstetrics & Gynecology

## 2019-05-24 ENCOUNTER — Observation Stay
Admission: EM | Admit: 2019-05-24 | Discharge: 2019-05-24 | Disposition: A | Discharge status: Home / Self Care | Payer: Medicaid Other | Attending: Obstetrics and Gynecology | Admitting: Obstetrics and Gynecology

## 2019-05-24 DIAGNOSIS — Z3A38 38 weeks gestation of pregnancy: Secondary | ICD-10-CM | POA: Diagnosis not present

## 2019-05-29 ENCOUNTER — Other Ambulatory Visit: Payer: Self-pay

## 2019-05-29 ENCOUNTER — Inpatient Hospital Stay: Payer: Medicaid Other | Attending: Oncology

## 2019-05-29 DIAGNOSIS — D519 Vitamin B12 deficiency anemia, unspecified: Secondary | ICD-10-CM | POA: Insufficient documentation

## 2019-05-29 DIAGNOSIS — D518 Other vitamin B12 deficiency anemias: Secondary | ICD-10-CM

## 2019-05-29 DIAGNOSIS — R200 Anesthesia of skin: Secondary | ICD-10-CM

## 2019-05-29 DIAGNOSIS — D508 Other iron deficiency anemias: Secondary | ICD-10-CM | POA: Diagnosis not present

## 2019-05-29 DIAGNOSIS — O99013 Anemia complicating pregnancy, third trimester: Secondary | ICD-10-CM

## 2019-05-29 DIAGNOSIS — Z3A39 39 weeks gestation of pregnancy: Secondary | ICD-10-CM | POA: Diagnosis not present

## 2019-05-29 LAB — FERRITIN: Ferritin: 74 ng/mL (ref 11–307)

## 2019-05-29 LAB — CBC WITH DIFFERENTIAL/PLATELET
Abs Immature Granulocytes: 0.04 10*3/uL (ref 0.00–0.07)
Basophils Absolute: 0 10*3/uL (ref 0.0–0.1)
Basophils Relative: 0 %
Eosinophils Absolute: 0 10*3/uL (ref 0.0–0.5)
Eosinophils Relative: 0 %
HCT: 32.1 % — ABNORMAL LOW (ref 36.0–46.0)
Hemoglobin: 10.2 g/dL — ABNORMAL LOW (ref 12.0–15.0)
Immature Granulocytes: 1 %
Lymphocytes Relative: 17 %
Lymphs Abs: 1.3 10*3/uL (ref 0.7–4.0)
MCH: 29 pg (ref 26.0–34.0)
MCHC: 31.8 g/dL (ref 30.0–36.0)
MCV: 91.2 fL (ref 80.0–100.0)
Monocytes Absolute: 0.5 10*3/uL (ref 0.1–1.0)
Monocytes Relative: 6 %
Neutro Abs: 6 10*3/uL (ref 1.7–7.7)
Neutrophils Relative %: 76 %
Platelets: 208 10*3/uL (ref 150–400)
RBC: 3.52 MIL/uL — ABNORMAL LOW (ref 3.87–5.11)
RDW: 18.7 % — ABNORMAL HIGH (ref 11.5–15.5)
WBC: 8 10*3/uL (ref 4.0–10.5)
nRBC: 0 % (ref 0.0–0.2)

## 2019-05-29 LAB — IRON AND TIBC
Iron: 104 ug/dL (ref 28–170)
Saturation Ratios: 20 % (ref 10.4–31.8)
TIBC: 532 ug/dL — ABNORMAL HIGH (ref 250–450)
UIBC: 428 ug/dL

## 2019-05-29 LAB — VITAMIN B12: Vitamin B-12: 137 pg/mL — ABNORMAL LOW (ref 180–914)

## 2019-05-30 ENCOUNTER — Encounter: Payer: Self-pay | Admitting: Oncology

## 2019-05-30 ENCOUNTER — Inpatient Hospital Stay (HOSPITAL_BASED_OUTPATIENT_CLINIC_OR_DEPARTMENT_OTHER): Payer: Medicaid Other | Admitting: Oncology

## 2019-05-30 ENCOUNTER — Inpatient Hospital Stay: Payer: Medicaid Other

## 2019-05-30 DIAGNOSIS — O99013 Anemia complicating pregnancy, third trimester: Secondary | ICD-10-CM | POA: Diagnosis not present

## 2019-05-30 DIAGNOSIS — D518 Other vitamin B12 deficiency anemias: Secondary | ICD-10-CM

## 2019-05-30 DIAGNOSIS — D508 Other iron deficiency anemias: Secondary | ICD-10-CM

## 2019-05-30 NOTE — Progress Notes (Signed)
HEMATOLOGY-ONCOLOGY TeleHEALTH VISIT PROGRESS NOTE  I connected with Sonya Melton on 05/30/19 at  1:45 PM EST by video enabled telemedicine visit and verified that I am speaking with the correct person using two identifiers. I discussed the limitations, risks, security and privacy concerns of performing an evaluation and management service by telemedicine and the availability of in-person appointments. I also discussed with the patient that there may be a patient responsible charge related to this service. The patient expressed understanding and agreed to proceed.   Other persons participating in the visit and their role in the encounter:  None  Patient's location: Home  Provider's location: office Chief Complaint: Anemia   INTERVAL HISTORY Sonya Melton is a 33 y.o. female who has above history reviewed by me today presents for follow up visit for management of anemia Problems and complaints are listed below:  Patient has received IV Venofer treatments during the interval.  She continues to feel fatigued. She is currently [redacted] weeks pregnant. She feels thirsty and drinks water all the time.  Denies any other new complaints. She has been taking oral vitamin B12 supplementation.  Review of Systems  Constitutional: Positive for fatigue. Negative for appetite change, chills and fever.  HENT:   Negative for hearing loss and voice change.   Eyes: Negative for eye problems.  Respiratory: Negative for chest tightness and cough.   Cardiovascular: Negative for chest pain.  Gastrointestinal: Negative for abdominal distention, abdominal pain and blood in stool.  Endocrine: Negative for hot flashes.       Always thirsty  Genitourinary: Negative for difficulty urinating and frequency.   Musculoskeletal: Negative for arthralgias.  Skin: Negative for itching and rash.  Neurological: Negative for extremity weakness.  Hematological: Negative for adenopathy.  Psychiatric/Behavioral: Negative for  confusion.    Past Medical History:  Diagnosis Date  . IDA (iron deficiency anemia) 04/01/2019  . IDA (iron deficiency anemia) 04/01/2019   Past Surgical History:  Procedure Laterality Date  . NO PAST SURGERIES      Family History  Problem Relation Age of Onset  . Lung disease Brother     Social History   Socioeconomic History  . Marital status: Married    Spouse name: Not on file  . Number of children: Not on file  . Years of education: Not on file  . Highest education level: Not on file  Occupational History  . Not on file  Tobacco Use  . Smoking status: Never Smoker  . Smokeless tobacco: Never Used  Substance and Sexual Activity  . Alcohol use: Not Currently  . Drug use: Never  . Sexual activity: Yes    Birth control/protection: Surgical  Other Topics Concern  . Not on file  Social History Narrative  . Not on file   Social Determinants of Health   Financial Resource Strain:   . Difficulty of Paying Living Expenses: Not on file  Food Insecurity:   . Worried About Programme researcher, broadcasting/film/video in the Last Year: Not on file  . Ran Out of Food in the Last Year: Not on file  Transportation Needs:   . Lack of Transportation (Medical): Not on file  . Lack of Transportation (Non-Medical): Not on file  Physical Activity:   . Days of Exercise per Week: Not on file  . Minutes of Exercise per Session: Not on file  Stress:   . Feeling of Stress : Not on file  Social Connections:   . Frequency of Communication with Friends and Family:  Not on file  . Frequency of Social Gatherings with Friends and Family: Not on file  . Attends Religious Services: Not on file  . Active Member of Clubs or Organizations: Not on file  . Attends Archivist Meetings: Not on file  . Marital Status: Not on file  Intimate Partner Violence:   . Fear of Current or Ex-Partner: Not on file  . Emotionally Abused: Not on file  . Physically Abused: Not on file  . Sexually Abused: Not on file     Current Outpatient Medications on File Prior to Visit  Medication Sig Dispense Refill  . ferrous sulfate 325 (65 FE) MG tablet Take by mouth 2 (two) times daily with a meal.     . Prenatal 28-0.8 MG TABS Take by mouth.    . vitamin B-12 (CYANOCOBALAMIN) 1000 MCG tablet Take 1 tablet (1,000 mcg total) by mouth daily. 90 tablet 0  . vitamin C (ASCORBIC ACID) 500 MG tablet Take 500 mg by mouth daily.     No current facility-administered medications on file prior to visit.    No Known Allergies     Observations/Objective: Today's Vitals   05/30/19 1345  PainSc: 0-No pain   There is no height or weight on file to calculate BMI.  Physical Exam  Constitutional: No distress.  Neurological: She is alert.  Psychiatric: Mood normal.    CBC    Component Value Date/Time   WBC 8.0 05/29/2019 1407   RBC 3.52 (L) 05/29/2019 1407   HGB 10.2 (L) 05/29/2019 1407   HCT 32.1 (L) 05/29/2019 1407   PLT 208 05/29/2019 1407   MCV 91.2 05/29/2019 1407   MCH 29.0 05/29/2019 1407   MCHC 31.8 05/29/2019 1407   RDW 18.7 (H) 05/29/2019 1407   LYMPHSABS 1.3 05/29/2019 1407   MONOABS 0.5 05/29/2019 1407   EOSABS 0.0 05/29/2019 1407   BASOSABS 0.0 05/29/2019 1407    CMP     Component Value Date/Time   NA 135 05/20/2019 1229   K 4.1 05/20/2019 1229   CL 104 05/20/2019 1229   CO2 20 (L) 05/20/2019 1229   GLUCOSE 88 05/20/2019 1229   BUN 10 05/20/2019 1229   CREATININE 0.60 05/20/2019 1229   CALCIUM 9.1 05/20/2019 1229   PROT 6.9 05/20/2019 1229   ALBUMIN 2.8 (L) 05/20/2019 1229   AST 15 05/20/2019 1229   ALT 8 05/20/2019 1229   ALKPHOS 144 (H) 05/20/2019 1229   BILITOT 1.1 05/20/2019 1229   GFRNONAA >60 05/20/2019 1229   GFRAA >60 05/20/2019 1229     Assessment and Plan: 1. Other iron deficiency anemia   2. Other vitamin B12 deficiency anemia   3. Anemia during pregnancy in third trimester   Iron deficiency anemia Labs reviewed and discussed with patient. Iron has improved to 10.3.   Iron panel has also improved.  No need for additional IV iron at this point. Recommend patient to continue take prenatal vitamins.  Vitamin B12 deficiency.  Despite taking oral vitamin B12 supplements, her B12 level remains low. Recommend patient to start parenteral vitamin B12 treatments.  Check antiparietal antibodies and intrinsic antibodies. We will start daily vitamin B12 1000 MCG for 5 doses.  She will also be scheduled for vitamin B12 injections weekly for 3-4 doses.  Follow-up in 8 weeks for reassessment  Follow Up Instructions: 8 weeks   I discussed the assessment and treatment plan with the patient. The patient was provided an opportunity to ask questions and all were  answered. The patient agreed with the plan and demonstrated an understanding of the instructions.  The patient was advised to call back or seek an in-person evaluation if the symptoms worsen or if the condition fails to improve as anticipated.    Rickard PatienceZhou Zyad Boomer, MD 05/30/2019 9:47 PM

## 2019-05-30 NOTE — Progress Notes (Signed)
Patient contacted for telehealth visit. Pt states she feels "dehydrated" despite drinking fluids. She stays thirsty all the time.

## 2019-05-31 ENCOUNTER — Inpatient Hospital Stay: Payer: Medicaid Other

## 2019-05-31 ENCOUNTER — Observation Stay
Admission: EM | Admit: 2019-05-31 | Discharge: 2019-05-31 | Disposition: A | Discharge status: Home / Self Care | Payer: Medicaid Other | Attending: Obstetrics and Gynecology | Admitting: Obstetrics and Gynecology

## 2019-05-31 ENCOUNTER — Other Ambulatory Visit: Payer: Self-pay

## 2019-05-31 ENCOUNTER — Encounter: Payer: Self-pay | Admitting: Obstetrics and Gynecology

## 2019-05-31 DIAGNOSIS — Z3A39 39 weeks gestation of pregnancy: Secondary | ICD-10-CM | POA: Insufficient documentation

## 2019-05-31 DIAGNOSIS — O471 False labor at or after 37 completed weeks of gestation: Principal | ICD-10-CM | POA: Insufficient documentation

## 2019-05-31 MED ORDER — CALCIUM CARBONATE ANTACID 500 MG PO CHEW: 2.0000 | CHEWABLE_TABLET | ORAL | Status: DC | PRN

## 2019-05-31 MED ORDER — ZOLPIDEM TARTRATE 5 MG PO TABS: 5.0000 mg | ORAL_TABLET | Freq: Every evening | ORAL | Status: DC | PRN

## 2019-05-31 MED ORDER — DOCUSATE SODIUM 100 MG PO CAPS: 100.0000 mg | ORAL_CAPSULE | Freq: Every day | ORAL | Status: DC

## 2019-05-31 MED ORDER — PRENATAL MULTIVITAMIN CH: 1.0000 | ORAL_TABLET | Freq: Every day | ORAL | Status: DC

## 2019-05-31 MED ORDER — ACETAMINOPHEN 325 MG PO TABS: 650.0000 mg | ORAL_TABLET | ORAL | Status: DC | PRN

## 2019-05-31 NOTE — Discharge Summary (Signed)
TRIAGE VISIT with NST   Sonya Melton is a 33 y.o. [id number]. She is at [redacted]w[redacted]d gestation, presenting with signs of labor. She was 2 cm in the office 3 hours ago.  Indication: Contractions  S: Resting comfortably. Irregular mild CTX, no VB. Active fetal movement. Concerned about vaginal bleeding, hx of anemia O:  LMP 06/28/2018 (Exact Date)  Results for orders placed or performed in visit on 05/29/19 (from the past 48 hour(s))  CBC with Differential/Platelet   Collection Time: 05/29/19  2:07 PM  Result Value Ref Range   WBC 8.0 4.0 - 10.5 K/uL   RBC 3.52 (L) 3.87 - 5.11 MIL/uL   Hemoglobin 10.2 (L) 12.0 - 15.0 g/dL   HCT 32.1 (L) 36.0 - 46.0 %   MCV 91.2 80.0 - 100.0 fL   MCH 29.0 26.0 - 34.0 pg   MCHC 31.8 30.0 - 36.0 g/dL   RDW 18.7 (H) 11.5 - 15.5 %   Platelets 208 150 - 400 K/uL   nRBC 0.0 0.0 - 0.2 %   Neutrophils Relative % 76 %   Neutro Abs 6.0 1.7 - 7.7 K/uL   Lymphocytes Relative 17 %   Lymphs Abs 1.3 0.7 - 4.0 K/uL   Monocytes Relative 6 %   Monocytes Absolute 0.5 0.1 - 1.0 K/uL   Eosinophils Relative 0 %   Eosinophils Absolute 0.0 0.0 - 0.5 K/uL   Basophils Relative 0 %   Basophils Absolute 0.0 0.0 - 0.1 K/uL   Immature Granulocytes 1 %   Abs Immature Granulocytes 0.04 0.00 - 0.07 K/uL  Vitamin B12   Collection Time: 05/29/19  2:07 PM  Result Value Ref Range   Vitamin B-12 137 (L) 180 - 914 pg/mL  Ferritin   Collection Time: 05/29/19  2:07 PM  Result Value Ref Range   Ferritin 74 11 - 307 ng/mL  Iron and TIBC   Collection Time: 05/29/19  2:07 PM  Result Value Ref Range   Iron 104 28 - 170 ug/dL   TIBC 532 (H) 250 - 450 ug/dL   Saturation Ratios 20 10.4 - 31.8 %   UIBC 428 ug/dL     Gen: NAD, AAOx3      Abd: FNTTP      Ext: Non-tender, Nonedmeatous    NST/FHT: 130, mod var, +15x15 accels, no decels TOCO: quiet SVE: 2cm, 70% effaced  NST: Reactive. See FHT above for particulars.  A/P:  33 y.o. [id number] [redacted]w[redacted]d with irregular contractions.   Labor: not  present.   Fetal Wellbeing: NST reactive Reassuring Cat 1 tracing.  D/c home stable, precautions for bleeding, decreased fetal movement reviewed, follow-up as scheduled.

## 2019-06-01 ENCOUNTER — Other Ambulatory Visit: Payer: Self-pay

## 2019-06-01 ENCOUNTER — Encounter: Payer: Self-pay | Admitting: Obstetrics and Gynecology

## 2019-06-01 ENCOUNTER — Inpatient Hospital Stay
Admission: EM | Admit: 2019-06-01 | Discharge: 2019-06-02 | DRG: 798 | Disposition: A | Discharge status: Home / Self Care | Payer: Medicaid Other | Attending: Obstetrics and Gynecology | Admitting: Obstetrics and Gynecology

## 2019-06-01 ENCOUNTER — Inpatient Hospital Stay: Payer: Medicaid Other

## 2019-06-01 DIAGNOSIS — Z302 Encounter for sterilization: Secondary | ICD-10-CM

## 2019-06-01 DIAGNOSIS — O43123 Velamentous insertion of umbilical cord, third trimester: Secondary | ICD-10-CM | POA: Diagnosis present

## 2019-06-01 DIAGNOSIS — Z3A39 39 weeks gestation of pregnancy: Secondary | ICD-10-CM

## 2019-06-01 DIAGNOSIS — O9902 Anemia complicating childbirth: Secondary | ICD-10-CM | POA: Diagnosis present

## 2019-06-01 DIAGNOSIS — O99824 Streptococcus B carrier state complicating childbirth: Secondary | ICD-10-CM | POA: Diagnosis present

## 2019-06-01 DIAGNOSIS — D509 Iron deficiency anemia, unspecified: Secondary | ICD-10-CM | POA: Diagnosis present

## 2019-06-01 DIAGNOSIS — Z20822 Contact with and (suspected) exposure to covid-19: Secondary | ICD-10-CM | POA: Diagnosis present

## 2019-06-01 DIAGNOSIS — O26893 Other specified pregnancy related conditions, third trimester: Secondary | ICD-10-CM | POA: Diagnosis present

## 2019-06-01 DIAGNOSIS — Z3490 Encounter for supervision of normal pregnancy, unspecified, unspecified trimester: Secondary | ICD-10-CM

## 2019-06-01 LAB — TYPE AND SCREEN
ABO/RH(D): B POS
Antibody Screen: NEGATIVE

## 2019-06-01 LAB — CBC
HCT: 32.8 % — ABNORMAL LOW (ref 36.0–46.0)
Hemoglobin: 10.6 g/dL — ABNORMAL LOW (ref 12.0–15.0)
MCH: 29 pg (ref 26.0–34.0)
MCHC: 32.3 g/dL (ref 30.0–36.0)
MCV: 89.6 fL (ref 80.0–100.0)
Platelets: 216 10*3/uL (ref 150–400)
RBC: 3.66 MIL/uL — ABNORMAL LOW (ref 3.87–5.11)
RDW: 18.6 % — ABNORMAL HIGH (ref 11.5–15.5)
WBC: 8.7 10*3/uL (ref 4.0–10.5)
nRBC: 0 % (ref 0.0–0.2)

## 2019-06-01 LAB — RESPIRATORY PANEL BY RT PCR (FLU A&B, COVID)
Influenza A by PCR: NEGATIVE
Influenza B by PCR: NEGATIVE
SARS Coronavirus 2 by RT PCR: NEGATIVE

## 2019-06-01 LAB — OB RESULTS CONSOLE HIV ANTIBODY (ROUTINE TESTING): HIV: NONREACTIVE

## 2019-06-01 LAB — RAPID HIV SCREEN (HIV 1/2 AB+AG)
HIV 1/2 Antibodies: NONREACTIVE
HIV-1 P24 Antigen - HIV24: NONREACTIVE

## 2019-06-01 LAB — RPR: RPR Ser Ql: NONREACTIVE

## 2019-06-01 LAB — ABO/RH: ABO/RH(D): B POS

## 2019-06-01 MED ORDER — ZOLPIDEM TARTRATE 5 MG PO TABS: 5.0000 mg | ORAL_TABLET | Freq: Every evening | ORAL | Status: DC | PRN

## 2019-06-01 MED ORDER — SODIUM CHLORIDE 0.9 % IV SOLN
2.0000 g | Freq: Once | INTRAVENOUS | Status: AC | PRN
  Administered 2019-06-01: 04:00:00 2 g via INTRAVENOUS

## 2019-06-01 MED ORDER — ACETAMINOPHEN 325 MG PO TABS: 650.0000 mg | ORAL_TABLET | ORAL | Status: DC | PRN

## 2019-06-01 MED ORDER — AMMONIA AROMATIC IN INHA
RESPIRATORY_TRACT | Status: AC
  Filled 2019-06-01: qty 10

## 2019-06-01 MED ORDER — LIDOCAINE HCL (PF) 1 % IJ SOLN
INTRAMUSCULAR | Status: AC
  Filled 2019-06-01: qty 30

## 2019-06-01 MED ORDER — OXYTOCIN 40 UNITS IN NORMAL SALINE INFUSION - SIMPLE MED
2.5000 [IU]/h | INTRAVENOUS | Status: DC
  Filled 2019-06-01: qty 1000

## 2019-06-01 MED ORDER — SODIUM CHLORIDE 0.9 % IV SOLN
1.0000 g | INTRAVENOUS | Status: DC
  Administered 2019-06-01: 1 g via INTRAVENOUS
  Filled 2019-06-01 (×10): qty 1000

## 2019-06-01 MED ORDER — SENNOSIDES-DOCUSATE SODIUM 8.6-50 MG PO TABS
2.0000 | ORAL_TABLET | ORAL | Status: DC
  Administered 2019-06-01: 23:00:00 2 via ORAL
  Filled 2019-06-01 (×2): qty 2

## 2019-06-01 MED ORDER — PRENATAL MULTIVITAMIN CH: 1.0000 | ORAL_TABLET | Freq: Every day | ORAL | Status: DC

## 2019-06-01 MED ORDER — WITCH HAZEL-GLYCERIN EX PADS: 1.0000 "application " | MEDICATED_PAD | CUTANEOUS | Status: DC | PRN

## 2019-06-01 MED ORDER — SIMETHICONE 80 MG PO CHEW: 80.0000 mg | CHEWABLE_TABLET | ORAL | Status: DC | PRN

## 2019-06-01 MED ORDER — SODIUM CHLORIDE 0.9 % IV SOLN: 250.0000 mL | INTRAVENOUS | Status: DC | PRN

## 2019-06-01 MED ORDER — MEASLES, MUMPS & RUBELLA VAC IJ SOLR
0.5000 mL | Freq: Once | INTRAMUSCULAR | Status: DC
  Filled 2019-06-01: qty 0.5

## 2019-06-01 MED ORDER — OXYTOCIN 10 UNIT/ML IJ SOLN
INTRAMUSCULAR | Status: AC
  Filled 2019-06-01: qty 2

## 2019-06-01 MED ORDER — DIPHENHYDRAMINE HCL 25 MG PO CAPS: 25.0000 mg | ORAL_CAPSULE | Freq: Four times a day (QID) | ORAL | Status: DC | PRN

## 2019-06-01 MED ORDER — FLEET ENEMA 7-19 GM/118ML RE ENEM: 1.0000 | ENEMA | Freq: Every day | RECTAL | Status: DC | PRN

## 2019-06-01 MED ORDER — TETANUS-DIPHTH-ACELL PERTUSSIS 5-2.5-18.5 LF-MCG/0.5 IM SUSP: 0.5000 mL | Freq: Once | INTRAMUSCULAR | Status: DC

## 2019-06-01 MED ORDER — SODIUM CHLORIDE 0.9 % IV SOLN
INTRAVENOUS | Status: AC
  Filled 2019-06-01: qty 2000

## 2019-06-01 MED ORDER — ONDANSETRON HCL 4 MG/2ML IJ SOLN: 4.0000 mg | INTRAMUSCULAR | Status: DC | PRN

## 2019-06-01 MED ORDER — SODIUM CHLORIDE 0.9% FLUSH: 3.0000 mL | Freq: Two times a day (BID) | INTRAVENOUS | Status: DC

## 2019-06-01 MED ORDER — SODIUM CHLORIDE 0.9 % IV SOLN
INTRAVENOUS | Status: AC
  Filled 2019-06-01: qty 1000

## 2019-06-01 MED ORDER — MISOPROSTOL 200 MCG PO TABS
ORAL_TABLET | ORAL | Status: AC
  Filled 2019-06-01: qty 4

## 2019-06-01 MED ORDER — BUTORPHANOL TARTRATE 1 MG/ML IJ SOLN
INTRAMUSCULAR | Status: AC
  Filled 2019-06-01: qty 1

## 2019-06-01 MED ORDER — COCONUT OIL OIL: 1.0000 "application " | TOPICAL_OIL | Status: DC | PRN

## 2019-06-01 MED ORDER — BISACODYL 10 MG RE SUPP: 10.0000 mg | Freq: Every day | RECTAL | Status: DC | PRN

## 2019-06-01 MED ORDER — SOD CITRATE-CITRIC ACID 500-334 MG/5ML PO SOLN: 30.0000 mL | ORAL | Status: DC | PRN

## 2019-06-01 MED ORDER — BUTORPHANOL TARTRATE 1 MG/ML IJ SOLN
1.0000 mg | INTRAMUSCULAR | Status: DC | PRN
  Administered 2019-06-01 (×2): 1 mg via INTRAVENOUS
  Filled 2019-06-01: qty 1

## 2019-06-01 MED ORDER — SODIUM CHLORIDE 0.9% FLUSH
3.0000 mL | INTRAVENOUS | Status: DC | PRN
  Administered 2019-06-01: 23:00:00 3 mL via INTRAVENOUS

## 2019-06-01 MED ORDER — ONDANSETRON HCL 4 MG/2ML IJ SOLN: 4.0000 mg | Freq: Four times a day (QID) | INTRAMUSCULAR | Status: DC | PRN

## 2019-06-01 MED ORDER — BENZOCAINE-MENTHOL 20-0.5 % EX AERO: 1.0000 "application " | INHALATION_SPRAY | CUTANEOUS | Status: DC | PRN

## 2019-06-01 MED ORDER — DIBUCAINE (PERIANAL) 1 % EX OINT: 1.0000 "application " | TOPICAL_OINTMENT | CUTANEOUS | Status: DC | PRN

## 2019-06-01 MED ORDER — LACTATED RINGERS IV SOLN: 500.0000 mL | INTRAVENOUS | Status: DC | PRN

## 2019-06-01 MED ORDER — IBUPROFEN 600 MG PO TABS
600.0000 mg | ORAL_TABLET | Freq: Four times a day (QID) | ORAL | Status: DC
  Administered 2019-06-01 (×2): 600 mg via ORAL
  Filled 2019-06-01 (×3): qty 1

## 2019-06-01 MED ORDER — LACTATED RINGERS IV SOLN: INTRAVENOUS | Status: DC

## 2019-06-01 MED ORDER — ONDANSETRON HCL 4 MG PO TABS: 4.0000 mg | ORAL_TABLET | ORAL | Status: DC | PRN

## 2019-06-01 MED ORDER — LIDOCAINE HCL (PF) 1 % IJ SOLN: 30.0000 mL | INTRAMUSCULAR | Status: DC | PRN

## 2019-06-01 MED ORDER — OXYTOCIN BOLUS FROM INFUSION
500.0000 mL | Freq: Once | INTRAVENOUS | Status: AC
  Administered 2019-06-01: 500 mL via INTRAVENOUS

## 2019-06-01 NOTE — Discharge Summary (Signed)
Obstetrical Discharge Summary  Patient Name: Sonya Melton DOB: [birth year] MRN: [id number]  Date of Admission: 06/01/2019 Date of Delivery: 06/01/19 Delivered by: Margaretmary Eddy, CNM, Dr. Elesa Massed present in room Date of Discharge: 06/02/2019  Primary OB: [facility]  FTD:DUKGURK'Y last menstrual period was 06/28/2018 (exact date). EDC Estimated Date of Delivery: 06/05/19 Gestational Age at Delivery: [redacted]w[redacted]d   Antepartum complications: Anemia, RPR reactive (Treponema negative) Admitting Diagnosis: Labor Secondary Diagnosis: Patient Active Problem List   Diagnosis Date Noted  . Supervision of normal pregnancy, antepartum 06/01/2019  . Indication for care in labor and delivery, antepartum 05/31/2019  . Labor and delivery, indication for care 05/24/2019  . Irregular uterine contractions 05/20/2019  . IDA (iron deficiency anemia) 04/01/2019    Augmentation: AROM Complications: None Intrapartum complications/course: Presented in labor, expectantly managed, IV meds for pain control. She progressed to complete and had a spontaneous vaginal birth of a live female over an intact perineum in hands and knees. The fetal head was delivered in OA position with restitution to ROA. No nuchal cord. Anterior then posterior shoulders delivered spontaneously. Baby was passed to mother, and assistance given to help hold infant.  Mother and infant were assisted from hands and knees to low fowlers. Infant was attended to by transition RN.  Cord doubly clamped and cut when pulseless and cut by father of baby.  Delivery Type: spontaneous vaginal delivery Anesthesia: IV stadol Placenta: spontaneous Laceration: None Episiotomy: none Newborn Data: Live born female  Birth Weight:  7lbs 7.2oz APGAR: 8/9  Newborn Delivery   Birth date/time: 06/01/2019 10:16:00 Delivery type: Vaginal, Spontaneous      Postpartum Procedures: Postpartum tubal ligation  Post partum course:  Patient had an uncomplicated  postpartum course. Had an PPTL by Dr. Elesa Massed on PPD 1. By time of discharge on PPD#1, her pain was controlled on oral pain medications; she had appropriate lochia and was ambulating, voiding without difficulty and tolerating regular diet.  She was deemed stable for discharge to home.    Discharge Physical Exam:  BP 125/89 (BP Location: Right Arm)   Pulse 70   Temp 98.7 F (37.1 C) (Oral)   Resp 20   Ht 5\' 1"  (1.549 m)   Wt 82.6 kg   LMP 06/28/2018 (Exact Date)   SpO2 99% Comment: RA  Breastfeeding Unknown   BMI 34.39 kg/m   General: NAD CV: RRR Pulm: CTABL, nl effort ABD: s/nd/nt, fundus firm and below the umbilicus Lochia: moderate Incision for BTL: c/d/I DVT Evaluation: LE non-ttp, no evidence of DVT on exam.  Hemoglobin  Date Value Ref Range Status  06/02/2019 9.0 (L) 12.0 - 15.0 g/dL Final   HCT  Date Value Ref Range Status  06/02/2019 26.7 (L) 36.0 - 46.0 % Final   Disposition: stable, discharge to home. Baby Feeding: breastmilk & formula Baby Disposition: home with mom  Rh Immune globulin given: n/a Rh pos Rubella vaccine given: n/a RI Flu vaccine given in AP or PP setting: AP Tdap vaccine given in AP or PP setting: AP  Contraception: BTL   Prenatal Labs:  Blood type/Rh   B pos  Antibody screen neg  Rubella Immune  Varicella Immune  RPR Reactive, treponema negative  HBsAg Neg  HIV NR  GC neg  Chlamydia neg  Genetic screening negative  1 hour GTT 161  3 hour GTT 99, 151, 124,110  GBS positive    Plan:  Tandi Hanko was discharged to home in good condition. Follow-up appointment with delivering provider in  6 weeks.  Discharge Medications: Allergies as of 06/02/2019   No Known Allergies     Medication List    TAKE these medications   acetaminophen 325 MG tablet Commonly known as: Tylenol Take 3 tablets (975 mg total) by mouth every 6 (six) hours as needed (for pain scale < 4).   coconut oil Oil Apply 1 application topically as needed.    ferrous sulfate 325 (65 FE) MG tablet Take by mouth 2 (two) times daily with a meal.   ibuprofen 600 MG tablet Commonly known as: ADVIL Take 1 tablet (600 mg total) by mouth every 6 (six) hours.   oxyCODONE 5 MG immediate release tablet Commonly known as: Oxy IR/ROXICODONE Take 1 tablet (5 mg total) by mouth every 4 (four) hours as needed for moderate pain.   Prenatal 28-0.8 MG Tabs Take by mouth.   senna-docusate 8.6-50 MG tablet Commonly known as: Senokot-S Take 2 tablets by mouth daily. Start taking on: June 03, 2019   vitamin B-12 1000 MCG tablet Commonly known as: CYANOCOBALAMIN Take 1 tablet (1,000 mcg total) by mouth daily.   vitamin C 500 MG tablet Commonly known as: ASCORBIC ACID Take 500 mg by mouth daily.       Follow-up Information    Minda Meo, CNM Follow up in 6 week(s).   Specialty: Certified Nurse Midwife Contact information: [address] [place] [address] [phone]           Signed:  Minda Meo, CNM

## 2019-06-01 NOTE — OB Triage Note (Signed)
Pt has been having UCs since 0800 05/31/2019, increased in intensity at 1900. Denis ROM. Uncomplicated pregnancy

## 2019-06-01 NOTE — H&P (Signed)
OB ADMISSION/ HISTORY & PHYSICAL:  Admission Date: 06/01/2019  2:54 AM  Admit Diagnosis: Contractions  Sonya Melton is a 33 y.o. [phone] (lost one child at age 11) presenting for contractions since yesterday. 2cm in the office, now 5 cm.  Prenatal History: [id number]   EDC : 06/05/2019, by [facility]  Prenatal care at [facility] Prenatal course complicated by   1. Anemia  Hgb at NOB 10.3 started on otc iron supplement w/ Vit C  Hgb at [redacted]w[redacted]d 8.8  12/11 hgb 10.0  Increased OTC iron to twice daily with vitamin C  Referral for iron infusions placed 03/17/2019 mah- seen 10/28 for consult 2. RPR Reactive  Treponema Negative, False Positive  3. Elevated A1C, 28w 1h OGTT elevated  1 hr Gtt done: 115  28w 1h OGTT: 161 03/17/2019  [x]  3h OGTT ordered 03/17/2019  03/23/2019: 99, 151, 124, 110 WNL   Medical / Surgical History :  Past medical history:  Past Medical History:  Diagnosis Date  . IDA (iron deficiency anemia) 04/01/2019  . IDA (iron deficiency anemia) 04/01/2019     Past surgical history:  Past Surgical History:  Procedure Laterality Date  . NO PAST SURGERIES      Family History:  Family History  Problem Relation Age of Onset  . Lung disease Brother      Social History:  reports that she has never smoked. She has never used smokeless tobacco. She reports previous alcohol use. She reports that she does not use drugs.   Allergies: Patient has no known allergies.    Current Medications at time of admission:  Prior to Admission medications   Medication Sig Start Date End Date Taking? Authorizing Provider  ferrous sulfate 325 (65 FE) MG tablet Take by mouth 2 (two) times daily with a meal.  03/17/19   [provider]  Prenatal 28-0.8 MG TABS Take by mouth.    [provider]  vitamin B-12 (CYANOCOBALAMIN) 1000 MCG tablet Take 1 tablet (1,000 mcg total) by mouth daily. 04/01/19   Earlie Server, MD  vitamin C (ASCORBIC ACID) 500 MG tablet Take 500 mg  by mouth daily.    [provider]     Review of Systems: Active FM Contractions, now regular and strong LOF  / SROM: nno bloody show no   Physical Exam:  VS: Last menstrual period 06/28/2018.  General: alert and oriented, appears NAD Heart: RRR Lungs: Clear lung fields Abdomen: Gravid, soft and non-tender, non-distended Extremities: mild edema  FHT: 120, moderate variability, +accels, no decels TOCO: q2 min SVE:  Dilation: 5 / Effacement (%): 80 / Station: -1    Cephalic by leopolds  Prenatal Labs: Blood type/Rh   B pos  Antibody screen neg  Rubella Immune  Varicella Immune  RPR Reactive, treponema negative  HBsAg Neg  HIV NR  GC neg  Chlamydia neg  Genetic screening negative  1 hour GTT 161  3 hour GTT 99, 151, 124,110  GBS positive   No results found.   01/19/2019: Anatomy: normal, Presentation: transverse, FHR: 138bpm, Previa: none, Placenta: posterior, CL: 4.33cm, SIUP, S=D  Assessment: [redacted]w[redacted]d weeks gestation 1 stage of labor FHR category 1  Plan:  Admit for active labor Labs pending Epidural when desired Continuous fetal monitoring   1. Fetal Well being  - Fetal Tracing: Cat I - Ultrasound: reviewed, as above - Group B Streptococcus: pos - Presentation: vtx confirmed by Leopolds   2. Routine OB: - Prenatal labs reviewed, as above - Rh  pos  3. Post Partum Planning: - Infant feeding: breast and bottle - Contraception: postpartum BTL

## 2019-06-01 NOTE — Anesthesia Preprocedure Evaluation (Addendum)
Anesthesia Evaluation  Patient identified by MRN, date of birth, ID band Patient awake    Reviewed: Allergy & Precautions, NPO status , Patient's Chart, lab work & pertinent test results  Airway Mallampati: II  TM Distance: >3 FB     Dental   Pulmonary neg pulmonary ROS,    Pulmonary exam normal        Cardiovascular negative cardio ROS Normal cardiovascular exam     Neuro/Psych negative neurological ROS  negative psych ROS   GI/Hepatic negative GI ROS, Neg liver ROS,   Endo/Other  negative endocrine ROS  Renal/GU negative Renal ROS  negative genitourinary   Musculoskeletal negative musculoskeletal ROS (+)   Abdominal Normal abdominal exam  (+)   Peds negative pediatric ROS (+)  Hematology  (+) anemia ,   Anesthesia Other Findings Past Medical History: 04/01/2019: IDA (iron deficiency anemia) 04/01/2019: IDA (iron deficiency anemia)   Reproductive/Obstetrics                            Anesthesia Physical Anesthesia Plan  ASA: II  Anesthesia Plan: General   Post-op Pain Management:    Induction: Intravenous  PONV Risk Score and Plan:   Airway Management Planned: Oral ETT  Additional Equipment:   Intra-op Plan:   Post-operative Plan: Extubation in OR  Informed Consent: I have reviewed the patients History and Physical, chart, labs and discussed the procedure including the risks, benefits and alternatives for the proposed anesthesia with the patient or authorized representative who has indicated his/her understanding and acceptance.     Dental advisory given  Plan Discussed with: CRNA and Surgeon  Anesthesia Plan Comments:         Anesthesia Quick Evaluation

## 2019-06-01 NOTE — Progress Notes (Signed)
Patient ID: Sonya Melton, female   DOB: 1986-04-29, 33 y.o.   MRN: [id number]   Labor Progress Note  Sonya Melton is a 33 y.o. G4P2011 at [redacted]w[redacted]d admitted for active labor  Subjective: Introduced myself, Pt is doing well.  FOB at [facility] and supportive.  Objective: BP 124/71   Pulse 75   Temp 98.4 F (36.9 C) (Oral)   Resp 18   Ht 5\' 1"  (1.549 m)   Wt 82.6 kg   LMP 06/28/2018 (Exact Date)   BMI 34.39 kg/m    Fetal Assessment: FHT:  FHR: 130 bpm, variability: moderate,  accelerations:  Present,  decelerations:  Absent Category/reactivity:  Category I UC:   regular, every 3 minutes SVE:   deferred Membrane status:Intact  Labs: Lab Results  Component Value Date   WBC 8.7 06/01/2019   HGB 10.6 (L) 06/01/2019   HCT 32.8 (L) 06/01/2019   MCV 89.6 06/01/2019   PLT 216 06/01/2019    Assessment / Plan: Spontaneous labor, progressing normally  Labor: Progressing normally Preeclampsia: 124/71 Fetal Wellbeing:  Category I Pain Control:  IV pain meds Stadol x 2 doses I/D:  GBS +,  ABX x 2 doses Anticipated MOD:  NSVD  Merrick, CNM 06/01/2019, 7:29 AM

## 2019-06-02 ENCOUNTER — Encounter
Admission: EM | Disposition: A | Discharge status: Home / Self Care | Payer: Self-pay | Source: Home / Self Care | Attending: Obstetrics and Gynecology

## 2019-06-02 ENCOUNTER — Inpatient Hospital Stay: Payer: Medicaid Other | Admitting: Anesthesiology

## 2019-06-02 ENCOUNTER — Encounter: Payer: Self-pay | Admitting: Obstetrics and Gynecology

## 2019-06-02 HISTORY — PX: TUBAL LIGATION: SHX77

## 2019-06-02 LAB — CBC
HCT: 26.7 % — ABNORMAL LOW (ref 36.0–46.0)
Hemoglobin: 9 g/dL — ABNORMAL LOW (ref 12.0–15.0)
MCH: 29.5 pg (ref 26.0–34.0)
MCHC: 33.7 g/dL (ref 30.0–36.0)
MCV: 87.5 fL (ref 80.0–100.0)
Platelets: 167 10*3/uL (ref 150–400)
RBC: 3.05 MIL/uL — ABNORMAL LOW (ref 3.87–5.11)
RDW: 18.8 % — ABNORMAL HIGH (ref 11.5–15.5)
WBC: 10.9 10*3/uL — ABNORMAL HIGH (ref 4.0–10.5)
nRBC: 0 % (ref 0.0–0.2)

## 2019-06-02 SURGERY — LIGATION, FALLOPIAN TUBE, POSTPARTUM
Anesthesia: General | Site: Abdomen | Laterality: Bilateral

## 2019-06-02 MED ORDER — LACTATED RINGERS IV SOLN: INTRAVENOUS | Status: DC

## 2019-06-02 MED ORDER — BUPIVACAINE HCL 0.5 % IJ SOLN
INTRAMUSCULAR | Status: DC | PRN
  Administered 2019-06-02: 10 mL

## 2019-06-02 MED ORDER — PROPOFOL 10 MG/ML IV BOLUS
INTRAVENOUS | Status: AC
  Filled 2019-06-02: qty 20

## 2019-06-02 MED ORDER — LIDOCAINE HCL (PF) 2 % IJ SOLN
INTRAMUSCULAR | Status: AC
  Filled 2019-06-02: qty 10

## 2019-06-02 MED ORDER — ROCURONIUM BROMIDE 50 MG/5ML IV SOLN
INTRAVENOUS | Status: AC
  Filled 2019-06-02: qty 1

## 2019-06-02 MED ORDER — ACETAMINOPHEN 10 MG/ML IV SOLN
INTRAVENOUS | Status: AC
  Filled 2019-06-02: qty 100

## 2019-06-02 MED ORDER — HYDROMORPHONE HCL 1 MG/ML IJ SOLN
INTRAMUSCULAR | Status: AC
  Filled 2019-06-02: qty 1

## 2019-06-02 MED ORDER — OXYCODONE HCL 5 MG PO TABS: 5.0000 mg | ORAL_TABLET | ORAL | 0 refills | Status: DC | PRN

## 2019-06-02 MED ORDER — SENNOSIDES-DOCUSATE SODIUM 8.6-50 MG PO TABS: 2.0000 | ORAL_TABLET | ORAL | Status: DC

## 2019-06-02 MED ORDER — DEXAMETHASONE SODIUM PHOSPHATE 10 MG/ML IJ SOLN
INTRAMUSCULAR | Status: DC | PRN
  Administered 2019-06-02: 10 mg via INTRAVENOUS

## 2019-06-02 MED ORDER — FENTANYL CITRATE (PF) 100 MCG/2ML IJ SOLN
INTRAMUSCULAR | Status: AC
  Filled 2019-06-02: qty 2

## 2019-06-02 MED ORDER — FENTANYL CITRATE (PF) 100 MCG/2ML IJ SOLN: 25.0000 ug | INTRAMUSCULAR | Status: DC | PRN

## 2019-06-02 MED ORDER — ACETAMINOPHEN 325 MG PO TABS: 1000.0000 mg | ORAL_TABLET | Freq: Four times a day (QID) | ORAL | Status: DC | PRN

## 2019-06-02 MED ORDER — KETOROLAC TROMETHAMINE 30 MG/ML IJ SOLN
INTRAMUSCULAR | Status: AC
  Filled 2019-06-02: qty 1

## 2019-06-02 MED ORDER — ONDANSETRON HCL 4 MG/2ML IJ SOLN: 4.0000 mg | Freq: Once | INTRAMUSCULAR | Status: DC | PRN

## 2019-06-02 MED ORDER — COCONUT OIL OIL: 1.0000 "application " | TOPICAL_OIL | 0 refills | Status: DC | PRN

## 2019-06-02 MED ORDER — SUGAMMADEX SODIUM 500 MG/5ML IV SOLN
INTRAVENOUS | Status: DC | PRN
  Administered 2019-06-02: 200 mg via INTRAVENOUS

## 2019-06-02 MED ORDER — MIDAZOLAM HCL 2 MG/2ML IJ SOLN
INTRAMUSCULAR | Status: DC | PRN
  Administered 2019-06-02: 2 mg via INTRAVENOUS

## 2019-06-02 MED ORDER — MIDAZOLAM HCL 2 MG/2ML IJ SOLN
INTRAMUSCULAR | Status: AC
  Filled 2019-06-02: qty 2

## 2019-06-02 MED ORDER — ACETAMINOPHEN 10 MG/ML IV SOLN
INTRAVENOUS | Status: DC | PRN
  Administered 2019-06-02: 1000 mg via INTRAVENOUS

## 2019-06-02 MED ORDER — OXYCODONE HCL 5 MG PO TABS: 10.0000 mg | ORAL_TABLET | ORAL | Status: DC | PRN

## 2019-06-02 MED ORDER — ROCURONIUM BROMIDE 100 MG/10ML IV SOLN
INTRAVENOUS | Status: DC | PRN
  Administered 2019-06-02: 30 mg via INTRAVENOUS
  Administered 2019-06-02: 10 mg via INTRAVENOUS

## 2019-06-02 MED ORDER — THROMBIN 5000 UNITS EX SOLR
CUTANEOUS | Status: AC
  Filled 2019-06-02: qty 5000

## 2019-06-02 MED ORDER — IBUPROFEN 600 MG PO TABS: 600.0000 mg | ORAL_TABLET | Freq: Four times a day (QID) | ORAL | 0 refills | Status: DC

## 2019-06-02 MED ORDER — LACTATED RINGERS IV SOLN: INTRAVENOUS | Status: DC | PRN

## 2019-06-02 MED ORDER — DEXMEDETOMIDINE HCL 200 MCG/2ML IV SOLN
INTRAVENOUS | Status: DC | PRN
  Administered 2019-06-02: 10 ug via INTRAVENOUS

## 2019-06-02 MED ORDER — HEMOSTATIC AGENTS (NO CHARGE) OPTIME
TOPICAL | Status: DC | PRN
  Administered 2019-06-02: 1

## 2019-06-02 MED ORDER — SUCCINYLCHOLINE CHLORIDE 20 MG/ML IJ SOLN
INTRAMUSCULAR | Status: DC | PRN
  Administered 2019-06-02: 100 mg via INTRAVENOUS

## 2019-06-02 MED ORDER — HYDROMORPHONE HCL 1 MG/ML IJ SOLN
INTRAMUSCULAR | Status: DC | PRN
  Administered 2019-06-02: .5 mg via INTRAVENOUS

## 2019-06-02 MED ORDER — OXYCODONE HCL 5 MG PO TABS: 5.0000 mg | ORAL_TABLET | ORAL | Status: DC | PRN

## 2019-06-02 MED ORDER — PROPOFOL 10 MG/ML IV BOLUS
INTRAVENOUS | Status: DC | PRN
  Administered 2019-06-02: 160 mg via INTRAVENOUS

## 2019-06-02 MED ORDER — ONDANSETRON HCL 4 MG/2ML IJ SOLN
INTRAMUSCULAR | Status: DC | PRN
  Administered 2019-06-02: 4 mg via INTRAVENOUS

## 2019-06-02 MED ORDER — SEVOFLURANE IN SOLN
RESPIRATORY_TRACT | Status: AC
  Filled 2019-06-02: qty 250

## 2019-06-02 MED ORDER — LIDOCAINE HCL (CARDIAC) PF 100 MG/5ML IV SOSY
PREFILLED_SYRINGE | INTRAVENOUS | Status: DC | PRN
  Administered 2019-06-02: 100 mg via INTRAVENOUS

## 2019-06-02 MED ORDER — FENTANYL CITRATE (PF) 100 MCG/2ML IJ SOLN
INTRAMUSCULAR | Status: DC | PRN
  Administered 2019-06-02 (×2): 50 ug via INTRAVENOUS

## 2019-06-02 MED ORDER — KETOROLAC TROMETHAMINE 15 MG/ML IJ SOLN
15.0000 mg | Freq: Four times a day (QID) | INTRAMUSCULAR | Status: DC
  Administered 2019-06-02: 12:00:00 15 mg via INTRAVENOUS
  Filled 2019-06-02 (×2): qty 1

## 2019-06-02 MED ORDER — FENTANYL CITRATE (PF) 100 MCG/2ML IJ SOLN
INTRAMUSCULAR | Status: AC
  Administered 2019-06-02: 11:00:00 25 ug via INTRAVENOUS
  Filled 2019-06-02: qty 2

## 2019-06-02 SURGICAL SUPPLY — 37 items
BLADE SURG 15 STRL LF DISP TIS (BLADE) ×1 IMPLANT
BLADE SURG 15 STRL SS (BLADE) ×2
BLADE SURG SZ11 CARB STEEL (BLADE) ×3 IMPLANT
CHLORAPREP W/TINT 26 (MISCELLANEOUS) ×3 IMPLANT
COVER WAND RF STERILE (DRAPES) ×3 IMPLANT
DERMABOND ADVANCED (GAUZE/BANDAGES/DRESSINGS) ×2
DERMABOND ADVANCED .7 DNX12 (GAUZE/BANDAGES/DRESSINGS) ×1 IMPLANT
DRAPE LAPAROTOMY 100X77 ABD (DRAPES) ×3 IMPLANT
DRSG TEGADERM 2-3/8X2-3/4 SM (GAUZE/BANDAGES/DRESSINGS) ×3 IMPLANT
DRSG TELFA 4X3 1S NADH ST (GAUZE/BANDAGES/DRESSINGS) ×3 IMPLANT
ELECT CAUTERY BLADE 6.4 (BLADE) ×3 IMPLANT
ELECT REM PT RETURN 9FT ADLT (ELECTROSURGICAL) ×3
ELECTRODE REM PT RTRN 9FT ADLT (ELECTROSURGICAL) ×1 IMPLANT
GLOVE PI ORTHOPRO 6.5 (GLOVE) ×2
GLOVE PI ORTHOPRO STRL 6.5 (GLOVE) ×1 IMPLANT
GLOVE SURG SYN 6.5 ES PF (GLOVE) ×3 IMPLANT
GOWN STRL REUS W/ TWL LRG LVL3 (GOWN DISPOSABLE) ×2 IMPLANT
GOWN STRL REUS W/TWL LRG LVL3 (GOWN DISPOSABLE) ×4
KIT TURNOVER CYSTO (KITS) ×3 IMPLANT
LABEL OR SOLS (LABEL) ×3 IMPLANT
LIGASURE VESSEL 5MM BLUNT TIP (ELECTROSURGICAL) IMPLANT
NEEDLE HYPO 22GX1.5 SAFETY (NEEDLE) ×3 IMPLANT
NS IRRIG 500ML POUR BTL (IV SOLUTION) ×3 IMPLANT
PACK BASIN MINOR ARMC (MISCELLANEOUS) ×3 IMPLANT
RETRACTOR RING XSMALL (MISCELLANEOUS) IMPLANT
RETRACTOR WOUND ALXS 18CM SML (MISCELLANEOUS) IMPLANT
RTRCTR WOUND ALEXIS 13CM XS SH (MISCELLANEOUS)
RTRCTR WOUND ALEXIS O 18CM SML (MISCELLANEOUS)
SPOGE SURGIFLO 8M (HEMOSTASIS) ×2
SPONGE LAP 18X18 RF (DISPOSABLE) ×3 IMPLANT
SPONGE SURGIFLO 8M (HEMOSTASIS) ×1 IMPLANT
SUT MNCRL AB 4-0 PS2 18 (SUTURE) ×3 IMPLANT
SUT VIC AB 2-0 CT1 27 (SUTURE) ×4
SUT VIC AB 2-0 CT1 TAPERPNT 27 (SUTURE) ×2 IMPLANT
SUT VIC AB 2-0 UR6 27 (SUTURE) ×3 IMPLANT
SYR 10ML LL (SYRINGE) ×3 IMPLANT
TRAY FOLEY MTR SLVR 16FR STAT (SET/KITS/TRAYS/PACK) IMPLANT

## 2019-06-02 NOTE — Transfer of Care (Signed)
Immediate Anesthesia Transfer of Care Note  Patient: Sonya Melton  Procedure(s) Performed: POST PARTUM TUBAL LIGATION (Bilateral Abdomen)  Patient Location: PACU  Anesthesia Type:General  Level of Consciousness: sedated  Airway & Oxygen Therapy: Patient Spontanous Breathing and Patient connected to face mask oxygen  Post-op Assessment: Report given to RN and Post -op Vital signs reviewed and stable  Post vital signs: Stable  Last Vitals:  Vitals Value Taken Time  BP 119/73 06/02/19 1041  Temp    Pulse 73 06/02/19 1043  Resp 18 06/02/19 1043  SpO2 100 % 06/02/19 1043  Vitals shown include unvalidated device data.  Last Pain:  Vitals:   06/02/19 0750  TempSrc:   PainSc: 0-No pain         Complications: No apparent anesthesia complications

## 2019-06-02 NOTE — Op Note (Signed)
  Post Partum Tubal Ligation  Indication for procedure: desired permanent sterilization  Pre-op diagnosis: s/p term vaginal delivery, desired permanent sterilization Post-op diagnosis: same Procedure: post partum bilateral tubal ligation Surgeon: Adriaan Maltese Assist: none Anesthesia: general  IVF: 1000cc EBL: 100cc UOP: -- Findings: patent bilateral tubes, normal post-gravid uterine fundus.  Tissues were fragile and degraded with minimal touch or manipulation.  Vigorous blood supply; all suture needle entry points bled.   Specimens: portion of left tube, portion of right tube Complications: none apparent Disposition: stable to PACU  Procedure in detail: The patient was seen and confirmed desire for permanent sterilization.  She was identified as Sonya Melton and was brought to the OR.  General anesthesia was administered, and the patient was prepped and draped in the usual sterile fashion.  A surgical time-out was called.  An 11-blade was used to incise the skin in the supraumbilical area.  The subcutaneous tissues were dissected to and then through the fascia, and the peritoneum was grasped and divided.  An alexis retractor was placed in the abdominal cavity and positioned.  The uterus was identified, and the right tube was grasped with a babcock and traced to the fimbriated end.  A kelly clamp was placed in a step-wise fashion along the mesosalpinx and metzenbaums were used to transect the tube from the underlying tissue.  2-0 Vicryl was used to tie the remaining tissue, and as described, any time the needle was placed, the area would bleed. The tissue would also pull off when the kelly clamp was applied.  Mattress sutures were eventually used to keep the ligated tissue hemostatic.  The same steps were used on the left side. Surgiflo was placed on both surgical sites.  The bilateral sites were confirmed hemostatic.  The alexis retractor was removed, and the fascia was reapproximated with 0  vicryl.  The subcutaneous tissue was irrigated and then the skin was closed with 4-0 monocryl.  The skin was covered in surgical glue.  The sponge, needle, and instrument counts were correct x2.  The patient tolerated the procedure and was brought to PACU in a stable condition.  Ranae Plumber, MD Attending Obstetrician and Gynecologist [facility]

## 2019-06-02 NOTE — Progress Notes (Signed)
DC inst reviewed.  Pt and husband v/o of care of self and baby for home.

## 2019-06-02 NOTE — Anesthesia Procedure Notes (Signed)
Procedure Name: Intubation Date/Time: 06/02/2019 8:31 AM Performed by: Almeta Monas, CRNA Pre-anesthesia Checklist: Patient identified, Patient being monitored, Timeout performed, Emergency Drugs available and Suction available Patient Re-evaluated:Patient Re-evaluated prior to induction Oxygen Delivery Method: Circle system utilized Preoxygenation: Pre-oxygenation with 100% oxygen Induction Type: IV induction, Rapid sequence and Cricoid Pressure applied Laryngoscope Size: 3 and McGraph Grade View: Grade I Tube type: Oral Tube size: 7.0 mm Number of attempts: 1 Airway Equipment and Method: Stylet and Video-laryngoscopy Placement Confirmation: ETT inserted through vocal cords under direct vision,  positive ETCO2 and breath sounds checked- equal and bilateral Secured at: 21 cm Tube secured with: Tape Dental Injury: Teeth and Oropharynx as per pre-operative assessment

## 2019-06-02 NOTE — Lactation Note (Signed)
This note was copied from a baby's chart. Lactation Consultation Note  Patient Name: Sonya Melton [id number] Date: 06/02/2019  Mom attempted breastfeeding with other children, last now 33 yr old, had difficulty latching, she states this baby is latching and nursing well with audible swallows, she is ready for d/c with baby , awaiting wheelchair, she states she is not on [facility] and does not have a breast pump, she plans on breastfeeding x 2 mths until she goes back to work and will then formula feed   Maternal Data    Feeding Feeding Type: Breast Fed  [facility] Score                   Interventions    Lactation Tools Discussed/Used     Consult Status      Dyann Kief 06/02/2019, 4:43 PM

## 2019-06-02 NOTE — Progress Notes (Signed)
Dc to home with NB.  To car via [facility] with auxillary staff.

## 2019-06-02 NOTE — Discharge Instructions (Signed)

## 2019-06-02 NOTE — H&P (Signed)
Post Partum Day 1 Subjective: Doing well, no complaints.  Tolerating regular diet, pain with PO meds, voiding and ambulating without difficulty. Lochia slightly heavier than period.  No CP SOB F/C N/V or leg pain no HA change of vision, RUQ/epigastric pain  Objective: BP 125/78   Pulse 66   Temp 98.4 F (36.9 C) (Oral)   Resp 18   Ht 5\' 1"  (1.549 m)   Wt 82.6 kg   LMP 06/28/2018 (Exact Date)   SpO2 100%   Breastfeeding Unknown   BMI 34.39 kg/m    Physical Exam:  General: NAD CV: RRR Pulm: nl effort, CTABL Lochia: moderate Uterine Fundus: fundus firm and below umbilicus DVT Evaluation: no cords, ttp LEs   Recent Labs    06/01/19 0318 06/02/19 0553  HGB 10.6* 9.0*  HCT 32.8* 26.7*  WBC 8.7 10.9*  PLT 216 167    Assessment/Plan: 34 y.o. 07/31/19 postpartum day # 1  1. Doing well, no complaints 2. Routine postpartum care 3. Lactation support 4. Desires PPTL - consent signed, risks reviewed, to OR when ready.  Has been NPO after midnight.   ----- [id number], MD Attending Obstetrician and Gynecologist [facility]

## 2019-06-05 ENCOUNTER — Inpatient Hospital Stay: Payer: Medicaid Other | Attending: Oncology

## 2019-06-05 LAB — FLUORESCENT TREPONEMAL AB(FTA)-IGG-BLD: Fluorescent Treponemal Ab, IgG: NONREACTIVE

## 2019-06-05 LAB — SURGICAL PATHOLOGY

## 2019-06-06 ENCOUNTER — Inpatient Hospital Stay: Payer: Medicaid Other

## 2019-06-06 LAB — SURGICAL PATHOLOGY

## 2019-06-07 ENCOUNTER — Inpatient Hospital Stay: Payer: Medicaid Other

## 2019-06-07 NOTE — Anesthesia Postprocedure Evaluation (Signed)
Anesthesia Post Note  Patient: Sonya Melton  Procedure(s) Performed: POST PARTUM TUBAL LIGATION (Bilateral Abdomen)  Patient location during evaluation: PACU Anesthesia Type: General Level of consciousness: awake and alert and oriented Pain management: pain level controlled Vital Signs Assessment: post-procedure vital signs reviewed and stable Respiratory status: spontaneous breathing Cardiovascular status: blood pressure returned to baseline Anesthetic complications: no     Last Vitals:  Vitals:   06/02/19 1200 06/02/19 1312  BP: 125/89 125/82  Pulse: 70 68  Resp: 20 18  Temp: 37.1 C 37.1 C  SpO2: 99% 98%    Last Pain:  Vitals:   06/02/19 1438  TempSrc:   PainSc: 0-No pain                 Patsy Varma

## 2019-06-14 ENCOUNTER — Ambulatory Visit: Payer: Medicaid Other

## 2019-06-19 ENCOUNTER — Inpatient Hospital Stay: Payer: Medicaid Other | Attending: Oncology

## 2019-06-21 ENCOUNTER — Ambulatory Visit: Payer: Medicaid Other

## 2019-06-26 ENCOUNTER — Inpatient Hospital Stay: Payer: Medicaid Other

## 2019-06-28 ENCOUNTER — Ambulatory Visit: Payer: Medicaid Other

## 2019-07-03 ENCOUNTER — Inpatient Hospital Stay: Payer: Medicaid Other | Attending: Oncology

## 2019-07-31 ENCOUNTER — Telehealth: Payer: Self-pay

## 2019-07-31 ENCOUNTER — Inpatient Hospital Stay: Payer: Medicaid Other

## 2019-07-31 ENCOUNTER — Inpatient Hospital Stay: Payer: Medicaid Other | Admitting: Oncology

## 2019-07-31 NOTE — Telephone Encounter (Signed)
Pt did not come to labs this morning and is scheduled for Mychart visit/ poss b12 & venofer today. Will need to be rescheduled.

## 2019-08-01 ENCOUNTER — Inpatient Hospital Stay: Payer: Medicaid Other

## 2019-08-15 ENCOUNTER — Telehealth: Payer: Self-pay | Admitting: *Deleted

## 2019-08-15 ENCOUNTER — Inpatient Hospital Stay: Payer: Medicaid Other

## 2019-08-15 ENCOUNTER — Inpatient Hospital Stay: Payer: Medicaid Other | Admitting: Oncology

## 2019-08-15 ENCOUNTER — Telehealth: Payer: Self-pay

## 2019-08-15 NOTE — Telephone Encounter (Signed)
Per pt request to cancel 3/16 lab/ MD visit and 08/16/19 +/- venofer appts pt stated that she will contact the office back at a later date to R/S.Marland Kitchen

## 2019-08-15 NOTE — Telephone Encounter (Signed)
Per pt request to cancel 3/16 lab/ MD visit and 08/16/19 +/- Venofer appts pt stated that she will contact the office back at a later date to R/S.Marland Kitchen

## 2019-08-15 NOTE — Telephone Encounter (Signed)
Pt did not come to labs this morning and is scheduled for Mychart visit today with poss b12 & venofer tomorrow. Will need to be rescheduled.  This will be the 2nd time these appts have been r/s.

## 2019-08-16 ENCOUNTER — Inpatient Hospital Stay: Payer: Medicaid Other

## 2019-09-13 ENCOUNTER — Encounter: Payer: Self-pay | Admitting: Oncology

## 2019-09-13 ENCOUNTER — Other Ambulatory Visit: Payer: Self-pay

## 2019-09-13 ENCOUNTER — Inpatient Hospital Stay: Payer: Medicaid Other

## 2019-09-13 ENCOUNTER — Inpatient Hospital Stay: Payer: Medicaid Other | Attending: Oncology | Admitting: Oncology

## 2019-09-13 DIAGNOSIS — D518 Other vitamin B12 deficiency anemias: Secondary | ICD-10-CM

## 2019-09-13 DIAGNOSIS — D508 Other iron deficiency anemias: Secondary | ICD-10-CM | POA: Diagnosis present

## 2019-09-13 DIAGNOSIS — E538 Deficiency of other specified B group vitamins: Secondary | ICD-10-CM | POA: Diagnosis not present

## 2019-09-13 LAB — CBC WITH DIFFERENTIAL/PLATELET
Abs Immature Granulocytes: 0.02 10*3/uL (ref 0.00–0.07)
Basophils Absolute: 0 10*3/uL (ref 0.0–0.1)
Basophils Relative: 0 %
Eosinophils Absolute: 0.1 10*3/uL (ref 0.0–0.5)
Eosinophils Relative: 1 %
HCT: 34.4 % — ABNORMAL LOW (ref 36.0–46.0)
Hemoglobin: 11.2 g/dL — ABNORMAL LOW (ref 12.0–15.0)
Immature Granulocytes: 0 %
Lymphocytes Relative: 24 %
Lymphs Abs: 1.7 10*3/uL (ref 0.7–4.0)
MCH: 29.9 pg (ref 26.0–34.0)
MCHC: 32.6 g/dL (ref 30.0–36.0)
MCV: 92 fL (ref 80.0–100.0)
Monocytes Absolute: 0.3 10*3/uL (ref 0.1–1.0)
Monocytes Relative: 4 %
Neutro Abs: 4.8 10*3/uL (ref 1.7–7.7)
Neutrophils Relative %: 71 %
Platelets: 321 10*3/uL (ref 150–400)
RBC: 3.74 MIL/uL — ABNORMAL LOW (ref 3.87–5.11)
RDW: 12.8 % (ref 11.5–15.5)
WBC: 6.8 10*3/uL (ref 4.0–10.5)
nRBC: 0 % (ref 0.0–0.2)

## 2019-09-13 LAB — FERRITIN: Ferritin: 20 ng/mL (ref 11–307)

## 2019-09-13 LAB — IRON AND TIBC
Iron: 76 ug/dL (ref 28–170)
Saturation Ratios: 19 % (ref 10.4–31.8)
TIBC: 409 ug/dL (ref 250–450)
UIBC: 333 ug/dL

## 2019-09-13 LAB — VITAMIN B12: Vitamin B-12: 228 pg/mL (ref 180–914)

## 2019-09-13 NOTE — Progress Notes (Signed)
Patient does not offer any problems today.  

## 2019-09-13 NOTE — Progress Notes (Signed)
HEMATOLOGY-ONCOLOGY TeleHEALTH VISIT PROGRESS NOTE  I connected with Sonya Melton on 09/13/19 at  2:00 PM EDT by video enabled telemedicine visit and verified that I am speaking with the correct person using two identifiers. I discussed the limitations, risks, security and privacy concerns of performing an evaluation and management service by telemedicine and the availability of in-person appointments. I also discussed with the patient that there may be a patient responsible charge related to this service. The patient expressed understanding and agreed to proceed.   Other persons participating in the visit and their role in the encounter:  None  Patient's location: Home  Provider's location: office Chief Complaint: follow up for anemia   INTERVAL HISTORY Sonya Melton is a 34 y.o. female who has above history reviewed by me today presents for follow up visit for management of anemia.  Problems and complaints are listed below:  Patient delivered on 05/03/2020. Today she feels fatigue is better, although not to base line yet.  She cancelled parental b12 injections.    Review of Systems  Constitutional: Positive for fatigue. Negative for appetite change, chills and fever.  HENT:   Negative for hearing loss and voice change.   Eyes: Negative for eye problems.  Respiratory: Negative for chest tightness and cough.   Cardiovascular: Negative for chest pain.  Gastrointestinal: Negative for abdominal distention, abdominal pain and blood in stool.  Endocrine: Negative for hot flashes.  Genitourinary: Negative for difficulty urinating and frequency.   Musculoskeletal: Negative for arthralgias.  Skin: Negative for itching and rash.  Neurological: Negative for extremity weakness.  Hematological: Negative for adenopathy.  Psychiatric/Behavioral: Negative for confusion.    Past Medical History:  Diagnosis Date  . IDA (iron deficiency anemia) 04/01/2019  . IDA (iron deficiency anemia) 04/01/2019    Past Surgical History:  Procedure Laterality Date  . NO PAST SURGERIES    . TUBAL LIGATION Bilateral 06/02/2019   Procedure: POST PARTUM TUBAL LIGATION;  Surgeon: Ward, Elenora Fender, MD;  Location: [facility] ORS;  Service: Gynecology;  Laterality: Bilateral;    Family History  Problem Relation Age of Onset  . Lung disease Brother     Social History   Socioeconomic History  . Marital status: Married    Spouse name: Not on file  . Number of children: Not on file  . Years of education: Not on file  . Highest education level: Not on file  Occupational History  . Not on file  Tobacco Use  . Smoking status: Never Smoker  . Smokeless tobacco: Never Used  Substance and Sexual Activity  . Alcohol use: Not Currently  . Drug use: Never  . Sexual activity: Yes    Birth control/protection: Surgical  Other Topics Concern  . Not on file  Social History Narrative  . Not on file   Social Determinants of Health   Financial Resource Strain:   . Difficulty of Paying Living Expenses:   Food Insecurity:   . Worried About Programme researcher, broadcasting/film/video in the Last Year:   . Barista in the Last Year:   Transportation Needs:   . Freight forwarder (Medical):   Marland Kitchen Lack of Transportation (Non-Medical):   Physical Activity:   . Days of Exercise per Week:   . Minutes of Exercise per Session:   Stress:   . Feeling of Stress :   Social Connections:   . Frequency of Communication with Friends and Family:   . Frequency of Social Gatherings with Friends and Family:   .  Attends Religious Services:   . Active Member of Clubs or Organizations:   . Attends Archivist Meetings:   Marland Kitchen Marital Status:   Intimate Partner Violence:   . Fear of Current or Ex-Partner:   . Emotionally Abused:   Marland Kitchen Physically Abused:   . Sexually Abused:     No current outpatient medications on file prior to visit.   No current facility-administered medications on file prior to visit.    No Known Allergies      Observations/Objective: Today's Vitals   09/13/19 1353  PainSc: 0-No pain   There is no height or weight on file to calculate BMI.  Physical Exam  CBC    Component Value Date/Time   WBC 6.8 09/13/2019 1039   RBC 3.74 (L) 09/13/2019 1039   HGB 11.2 (L) 09/13/2019 1039   HCT 34.4 (L) 09/13/2019 1039   PLT 321 09/13/2019 1039   MCV 92.0 09/13/2019 1039   MCH 29.9 09/13/2019 1039   MCHC 32.6 09/13/2019 1039   RDW 12.8 09/13/2019 1039   LYMPHSABS 1.7 09/13/2019 1039   MONOABS 0.3 09/13/2019 1039   EOSABS 0.1 09/13/2019 1039   BASOSABS 0.0 09/13/2019 1039    CMP     Component Value Date/Time   NA 135 05/20/2019 1229   K 4.1 05/20/2019 1229   CL 104 05/20/2019 1229   CO2 20 (L) 05/20/2019 1229   GLUCOSE 88 05/20/2019 1229   BUN 10 05/20/2019 1229   CREATININE 0.60 05/20/2019 1229   CALCIUM 9.1 05/20/2019 1229   PROT 6.9 05/20/2019 1229   ALBUMIN 2.8 (L) 05/20/2019 1229   AST 15 05/20/2019 1229   ALT 8 05/20/2019 1229   ALKPHOS 144 (H) 05/20/2019 1229   BILITOT 1.1 05/20/2019 1229   GFRNONAA >60 05/20/2019 1229   GFRAA >60 05/20/2019 1229     Assessment and Plan: 1. Other iron deficiency anemia   2. Other vitamin B12 deficiency anemia     Labs are reviewed and discussed with patient. Mild anemia with hemoglobin 11.2,  Recommend patient to continue oral iron supplementation daily.   Vitamin b12 deficiency, B12 has improved. Recommend her to continue oral B12 supplementation.  Intrinsic factor antibody and anti parietal antibody were pending.    Follow Up Instructions:  6 months.   I discussed the assessment and treatment plan with the patient. The patient was provided an opportunity to ask questions and all were answered. The patient agreed with the plan and demonstrated an understanding of the instructions.  The patient was advised to call back or seek an in-person evaluation if the symptoms worsen or if the condition fails to improve as anticipated.     Earlie Server, MD 09/13/2019 10:46 PM

## 2019-09-14 ENCOUNTER — Inpatient Hospital Stay: Payer: Medicaid Other

## 2019-09-15 LAB — ANTI-PARIETAL ANTIBODY: Parietal Cell Antibody-IgG: 1.9 Units (ref 0.0–20.0)

## 2019-09-15 LAB — INTRINSIC FACTOR ANTIBODIES: Intrinsic Factor: 1 AU/mL (ref 0.0–1.1)

## 2019-09-25 ENCOUNTER — Emergency Department
Admission: EM | Admit: 2019-09-25 | Discharge: 2019-09-25 | Discharge status: Against Medical Advice | Payer: Medicaid Other

## 2019-09-26 ENCOUNTER — Emergency Department
Admission: EM | Admit: 2019-09-26 | Discharge: 2019-09-26 | Disposition: A | Discharge status: Home / Self Care | Payer: Medicaid Other | Attending: Student | Admitting: Student

## 2019-09-26 ENCOUNTER — Other Ambulatory Visit: Payer: Self-pay

## 2019-09-26 ENCOUNTER — Encounter: Payer: Self-pay | Admitting: Emergency Medicine

## 2019-09-26 DIAGNOSIS — K0889 Other specified disorders of teeth and supporting structures: Secondary | ICD-10-CM | POA: Diagnosis not present

## 2019-09-26 MED ORDER — AMOXICILLIN 500 MG PO CAPS
500.0000 mg | ORAL_CAPSULE | Freq: Once | ORAL | Status: AC
  Administered 2019-09-26: 500 mg via ORAL
  Filled 2019-09-26: qty 1

## 2019-09-26 MED ORDER — AMOXICILLIN 500 MG PO CAPS: 500.0000 mg | ORAL_CAPSULE | Freq: Three times a day (TID) | ORAL | 0 refills | Status: AC

## 2019-09-26 MED ORDER — IBUPROFEN 600 MG PO TABS: 600.0000 mg | ORAL_TABLET | Freq: Four times a day (QID) | ORAL | 0 refills | Status: AC | PRN

## 2019-09-26 MED ORDER — OXYCODONE-ACETAMINOPHEN 5-325 MG PO TABS
1.0000 | ORAL_TABLET | Freq: Once | ORAL | Status: AC
  Administered 2019-09-26: 1 via ORAL
  Filled 2019-09-26: qty 1

## 2019-09-26 MED ORDER — KETOROLAC TROMETHAMINE 30 MG/ML IJ SOLN
30.0000 mg | Freq: Once | INTRAMUSCULAR | Status: AC
  Administered 2019-09-26: 13:00:00 30 mg via INTRAMUSCULAR
  Filled 2019-09-26: qty 1

## 2019-09-26 MED ORDER — LIDOCAINE VISCOUS HCL 2 % MT SOLN
15.0000 mL | Freq: Once | OROMUCOSAL | Status: AC
  Administered 2019-09-26: 13:00:00 15 mL via OROMUCOSAL
  Filled 2019-09-26: qty 15

## 2019-09-26 MED ORDER — LIDOCAINE VISCOUS HCL 2 % MT SOLN: 10.0000 mL | OROMUCOSAL | 0 refills | Status: AC | PRN

## 2019-09-26 MED ORDER — OXYCODONE-ACETAMINOPHEN 5-325 MG PO TABS: 1.0000 | ORAL_TABLET | Freq: Three times a day (TID) | ORAL | 0 refills | Status: AC | PRN

## 2019-09-26 NOTE — ED Notes (Signed)
See triage note- pt here for left sided dental pain, states pain radiates to behind her left ear. Swelling present.

## 2019-09-26 NOTE — ED Provider Notes (Signed)
[facility] Emergency Department Provider Note  ____________________________________________  Time seen: Approximately 12:18 PM  I have reviewed the triage vital signs and the nursing notes.   HISTORY  Chief Complaint Dental Pain    HPI Sonya Melton is a 34 y.o. female that presents to the emergency department for evaluation of left sided facial swelling and dental pain for 1 day. Patient had dental work done on her left upper molar over the weekend.She called her dentist but they were unable to get her in until tomorrow. She came to the emergency department for pain control. No fevers.   Past Medical History:  Diagnosis Date  . IDA (iron deficiency anemia) 04/01/2019  . IDA (iron deficiency anemia) 04/01/2019    Patient Active Problem List   Diagnosis Date Noted  . Supervision of normal pregnancy, antepartum 06/01/2019  . Indication for care in labor and delivery, antepartum 05/31/2019  . Labor and delivery, indication for care 05/24/2019  . Irregular uterine contractions 05/20/2019  . IDA (iron deficiency anemia) 04/01/2019    Past Surgical History:  Procedure Laterality Date  . NO PAST SURGERIES    . TUBAL LIGATION Bilateral 06/02/2019   Procedure: POST PARTUM TUBAL LIGATION;  Surgeon: Ward, Elenora Fender, MD;  Location: [facility] ORS;  Service: Gynecology;  Laterality: Bilateral;    Prior to Admission medications   Medication Sig Start Date End Date Taking? Authorizing Provider  amoxicillin (AMOXIL) 500 MG capsule Take 1 capsule (500 mg total) by mouth 3 (three) times daily. 09/26/19   Enid Derry, PA-C  ibuprofen (ADVIL) 600 MG tablet Take 1 tablet (600 mg total) by mouth every 6 (six) hours as needed. 09/26/19   Enid Derry, PA-C  lidocaine (XYLOCAINE) 2 % solution Use as directed 10 mLs in the mouth or throat as needed. 09/26/19   Enid Derry, PA-C  oxyCODONE-acetaminophen (PERCOCET) 5-325 MG tablet Take 1 tablet by mouth every 8 (eight) hours  as needed for up to 2 days for severe pain. 09/26/19 09/28/19  Enid Derry, PA-C    Allergies Patient has no known allergies.  Family History  Problem Relation Age of Onset  . Lung disease Brother     Social History Social History   Tobacco Use  . Smoking status: Never Smoker  . Smokeless tobacco: Never Used  Substance Use Topics  . Alcohol use: Not Currently  . Drug use: Never     Review of Systems  Constitutional: No fever/chills Respiratory: No SOB. Gastrointestinal: No abdominal pain.  No nausea, no vomiting.  Musculoskeletal: Negative for musculoskeletal pain. Skin: Negative for rash, abrasions, lacerations, ecchymosis. Neurological: Negative for headaches   ____________________________________________   PHYSICAL EXAM:  VITAL SIGNS: ED Triage Vitals  Enc Vitals Group     BP 09/26/19 1138 (!) 154/102     Pulse Rate 09/26/19 1138 78     Resp 09/26/19 1138 18     Temp 09/26/19 1138 97.9 F (36.6 C)     Temp Source 09/26/19 1138 Oral     SpO2 09/26/19 1138 100 %     Weight 09/26/19 1134 178 lb (80.7 kg)     Height 09/26/19 1134 5\' 1"  (1.549 m)     Head Circumference --      Peak Flow --      Pain Score 09/26/19 1134 10     Pain Loc --      Pain Edu? --      Excl. in GC? --      Constitutional: Alert  and oriented. Well appearing and in no acute distress. Eyes: Conjunctivae are normal. PERRL. EOMI. Head: Atraumatic. ENT:      Ears:      Nose: No congestion/rhinnorhea.      Mouth/Throat: Mucous membranes are moist. Good dentition. No obvious cavities. Swelling to left cheek. Neck: No stridor.   Cardiovascular: Normal rate, regular rhythm.  Good peripheral circulation. Respiratory: Normal respiratory effort without tachypnea or retractions. Lungs CTAB. Good air entry to the bases with no decreased or absent breath sounds. Musculoskeletal: Full range of motion to all extremities. No gross deformities appreciated. Neurologic:  Normal speech and  language. No gross focal neurologic deficits are appreciated.  Skin:  Skin is warm, dry and intact. No rash noted. Psychiatric: Mood and affect are normal. Speech and behavior are normal. Patient exhibits appropriate insight and judgement.   ____________________________________________   LABS (all labs ordered are listed, but only abnormal results are displayed)  Labs Reviewed - No data to display ____________________________________________  EKG   ____________________________________________  RADIOLOGY   No results found.  ____________________________________________    PROCEDURES  Procedure(s) performed:    Procedures    Medications  oxyCODONE-acetaminophen (PERCOCET/ROXICET) 5-325 MG per tablet 1 tablet (1 tablet Oral Given 09/26/19 1231)  ketorolac (TORADOL) 30 MG/ML injection 30 mg (30 mg Intramuscular Given 09/26/19 1232)  lidocaine (XYLOCAINE) 2 % viscous mouth solution 15 mL (15 mLs Mouth/Throat Given 09/26/19 1231)  amoxicillin (AMOXIL) capsule 500 mg (500 mg Oral Given 09/26/19 1231)     ____________________________________________   INITIAL IMPRESSION / ASSESSMENT AND PLAN / ED COURSE  Pertinent labs & imaging results that were available during my care of the patient were reviewed by me and considered in my medical decision making (see chart for details).  Review of the [place] CSRS was performed in accordance of the [place] prior to dispensing any controlled drugs.   Patient's diagnosis is consistent with dental pain.  Patient was given IM Toradol, oral Percocet, viscous lidocaine and amoxicillin in the emergency department.  Patient will follow up with her dentist tomorrow.  Patient will be discharged home with prescriptions for amoxicillin and a short course of Percocet.  Patient is to follow up with dentist as directed. Patient is given ED precautions to return to the ED for any worsening or new symptoms.   Sonya Melton was evaluated in Emergency  Department on 09/26/2019 for the symptoms described in the history of present illness. She was evaluated in the context of the global COVID-19 pandemic, which necessitated consideration that the patient might be at risk for infection with the SARS-CoV-2 virus that causes COVID-19. Institutional protocols and algorithms that pertain to the evaluation of patients at risk for COVID-19 are in a state of rapid change based on information released by regulatory bodies including the CDC and federal and state organizations. These policies and algorithms were followed during the patient's care in the ED.  ____________________________________________  FINAL CLINICAL IMPRESSION(S) / ED DIAGNOSES  Final diagnoses:  Pain, dental      NEW MEDICATIONS STARTED DURING THIS VISIT:  ED Discharge Orders         Ordered    lidocaine (XYLOCAINE) 2 % solution  As needed     09/26/19 1258    oxyCODONE-acetaminophen (PERCOCET) 5-325 MG tablet  Every 8 hours PRN     09/26/19 1258    ibuprofen (ADVIL) 600 MG tablet  Every 6 hours PRN     09/26/19 1258    amoxicillin (AMOXIL) 500 MG capsule  3 times daily     09/26/19 1258              This chart was dictated using voice recognition software/Dragon. Despite best efforts to proofread, errors can occur which can change the meaning. Any change was purely unintentional.    Enid Derry, PA-C 09/26/19 1523    Miguel Aschoff., MD 09/26/19 [phone]

## 2019-09-26 NOTE — ED Triage Notes (Signed)
Patient is complaining of left sided dental pain after she had a filling placed on Saturday.  Patient is tearful.  Patient's face is somewhat swollen.  Patient is in no obvious distress at this time.

## 2019-10-14 ENCOUNTER — Other Ambulatory Visit: Payer: Self-pay

## 2019-10-14 ENCOUNTER — Ambulatory Visit: Payer: Medicaid Other | Attending: Internal Medicine

## 2019-10-14 DIAGNOSIS — Z23 Encounter for immunization: Secondary | ICD-10-CM

## 2019-10-14 NOTE — Progress Notes (Signed)
   Covid-19 Vaccination Clinic  Name:  Sonya Melton    MRN: [id number] DOB: 09-15-1985  10/14/2019  [name] was observed post Covid-19 immunization for 15 minutes without incident. She was provided with Vaccine Information Sheet and instruction to access the V-Safe system.   [name] was instructed to call 911 with any severe reactions post vaccine: Marland Kitchen Difficulty breathing  . Swelling of face and throat  . A fast heartbeat  . A bad rash all over body  . Dizziness and weakness   Immunizations Administered    Name Date Dose VIS Date Route   Pfizer COVID-19 Vaccine 10/14/2019  9:01 AM 0.3 mL 07/26/2018 Intramuscular   Manufacturer: [employer], [employer]   Lot: [id number]   NDC: [address]-8

## 2019-11-07 ENCOUNTER — Ambulatory Visit: Payer: Medicaid Other

## 2019-11-10 ENCOUNTER — Ambulatory Visit: Payer: Medicaid Other | Attending: Internal Medicine

## 2019-11-10 DIAGNOSIS — Z23 Encounter for immunization: Secondary | ICD-10-CM

## 2019-11-10 NOTE — Progress Notes (Signed)
   Covid-19 Vaccination Clinic  Name:  Sonya Melton    MRN: [id number] DOB: December 25, 1985  11/10/2019  [name] was observed post Covid-19 immunization for 15 minutes without incident. She was provided with Vaccine Information Sheet and instruction to access the V-Safe system.   [name] was instructed to call 911 with any severe reactions post vaccine: Marland Kitchen Difficulty breathing  . Swelling of face and throat  . A fast heartbeat  . A bad rash all over body  . Dizziness and weakness   Immunizations Administered    Name Date Dose VIS Date Route   Pfizer COVID-19 Vaccine 11/10/2019  8:10 AM 0.3 mL 07/26/2018 Intramuscular   Manufacturer: [employer], [employer]   Lot: [id number]   NDC: [address]-6

## 2020-03-13 ENCOUNTER — Inpatient Hospital Stay: Payer: Medicaid Other | Attending: Oncology

## 2020-03-13 ENCOUNTER — Other Ambulatory Visit: Payer: Self-pay

## 2020-03-13 DIAGNOSIS — D518 Other vitamin B12 deficiency anemias: Secondary | ICD-10-CM

## 2020-03-13 DIAGNOSIS — E538 Deficiency of other specified B group vitamins: Secondary | ICD-10-CM | POA: Insufficient documentation

## 2020-03-13 DIAGNOSIS — D508 Other iron deficiency anemias: Secondary | ICD-10-CM | POA: Diagnosis not present

## 2020-03-13 LAB — CBC WITH DIFFERENTIAL/PLATELET
Abs Immature Granulocytes: 0.02 10*3/uL (ref 0.00–0.07)
Basophils Absolute: 0 10*3/uL (ref 0.0–0.1)
Basophils Relative: 0 %
Eosinophils Absolute: 0.1 10*3/uL (ref 0.0–0.5)
Eosinophils Relative: 1 %
HCT: 33.3 % — ABNORMAL LOW (ref 36.0–46.0)
Hemoglobin: 11.1 g/dL — ABNORMAL LOW (ref 12.0–15.0)
Immature Granulocytes: 0 %
Lymphocytes Relative: 27 %
Lymphs Abs: 2.1 10*3/uL (ref 0.7–4.0)
MCH: 28.8 pg (ref 26.0–34.0)
MCHC: 33.3 g/dL (ref 30.0–36.0)
MCV: 86.3 fL (ref 80.0–100.0)
Monocytes Absolute: 0.4 10*3/uL (ref 0.1–1.0)
Monocytes Relative: 5 %
Neutro Abs: 5.1 10*3/uL (ref 1.7–7.7)
Neutrophils Relative %: 67 %
Platelets: 350 10*3/uL (ref 150–400)
RBC: 3.86 MIL/uL — ABNORMAL LOW (ref 3.87–5.11)
RDW: 13.8 % (ref 11.5–15.5)
WBC: 7.7 10*3/uL (ref 4.0–10.5)
nRBC: 0 % (ref 0.0–0.2)

## 2020-03-13 LAB — IRON AND TIBC
Iron: 56 ug/dL (ref 28–170)
Saturation Ratios: 14 % (ref 10.4–31.8)
TIBC: 413 ug/dL (ref 250–450)
UIBC: 357 ug/dL

## 2020-03-13 LAB — VITAMIN B12: Vitamin B-12: 215 pg/mL (ref 180–914)

## 2020-03-13 LAB — FERRITIN: Ferritin: 12 ng/mL (ref 11–307)

## 2020-03-15 ENCOUNTER — Inpatient Hospital Stay: Payer: Medicaid Other | Admitting: Oncology

## 2020-03-25 ENCOUNTER — Telehealth: Payer: Self-pay

## 2020-03-25 NOTE — Telephone Encounter (Signed)
FYI... Tried calling pt x2. No answer A detailed message was left for pt to contact the office back so that we can get her 03/15/20 MD visit R/S for a date and time  that works best for her

## 2020-03-25 NOTE — Telephone Encounter (Signed)
Please contact patient to get the NS r/s.

## 2020-03-25 NOTE — Telephone Encounter (Signed)
-----   Message from Rickard Patience, MD sent at 03/24/2020 10:24 PM EDT ----- Need reschedule. She had blood work done but no showed.

## 2020-09-26 IMAGING — US TRANSVAGINAL ULTRASOUND OF PELVIS
1 series · 13 of 25 positions shown · non-contrast
Comparison: 01/07/2010

CLINICAL DATA: Lower abdominal pain, vaginal bleeding.

EXAM:
TRANSABDOMINAL AND TRANSVAGINAL ULTRASOUND OF PELVIS
DOPPLER ULTRASOUND OF OVARIES
TECHNIQUE: Both transabdominal and transvaginal ultrasound examinations of the
pelvis were performed. Transabdominal technique was performed for
global imaging of the pelvis including uterus, ovaries, adnexal
regions, and pelvic cul-de-sac.
It was necessary to proceed with endovaginal exam following the
transabdominal exam to visualize the ovaries. Color and duplex
Doppler ultrasound was utilized to evaluate blood flow to the
ovaries.

[Series 1: transvaginal ultrasound of pelvis · 13 of 68 slices shown]
[im 1/68]
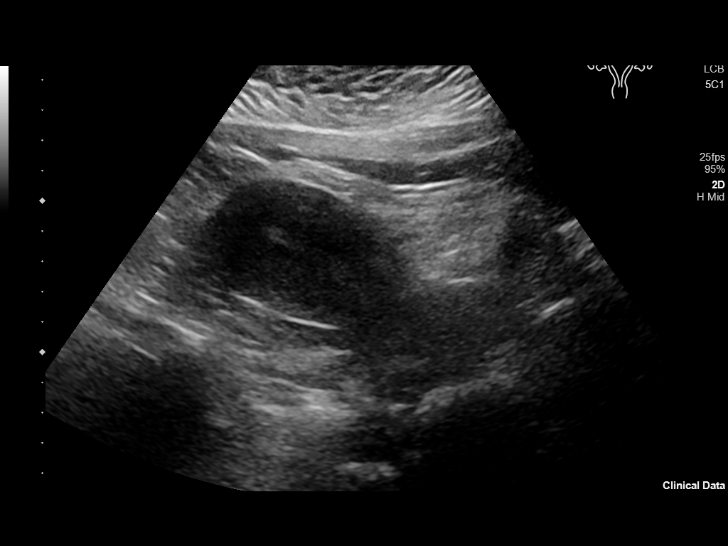
[im 6/68]
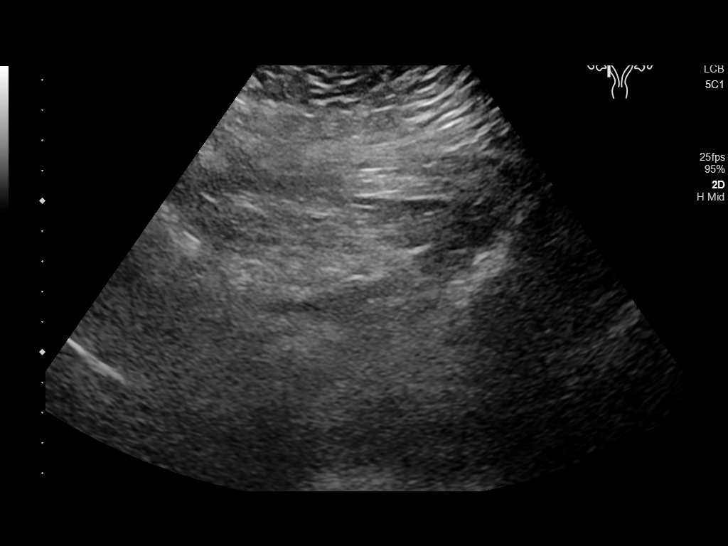
[im 12/68]
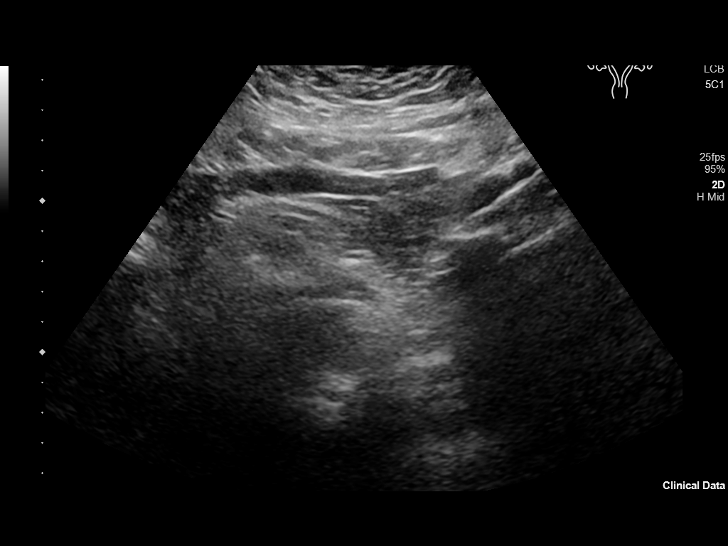
[im 17/68]
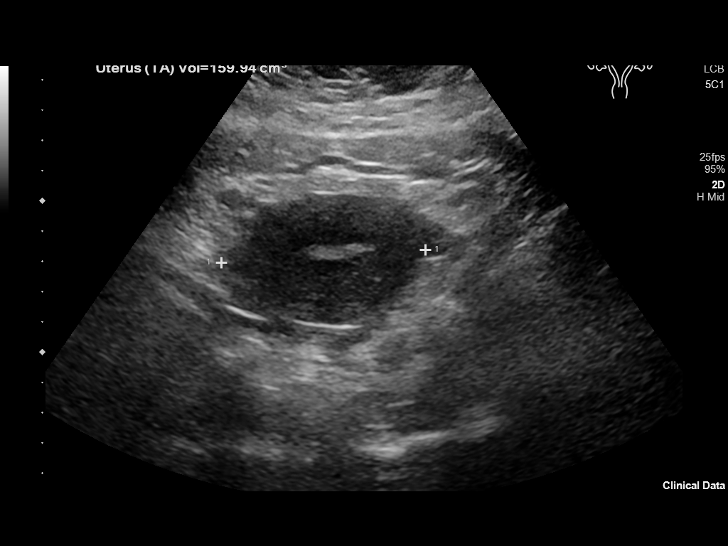
[im 23/68]
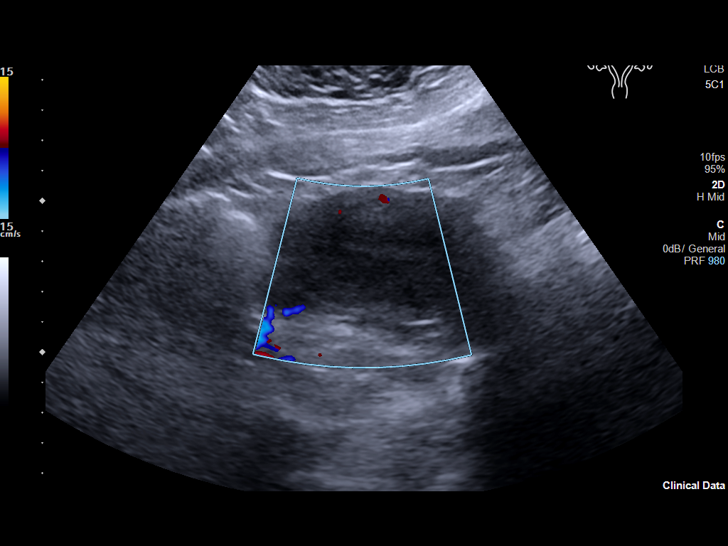
[im 28/68]
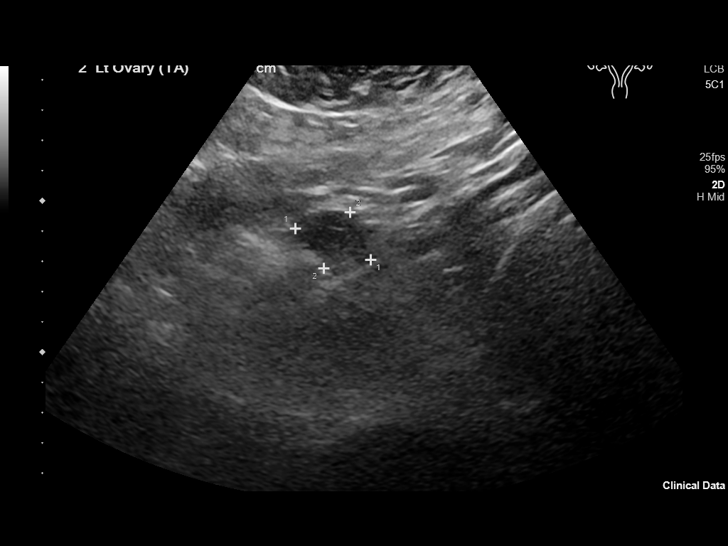
[im 34/68]
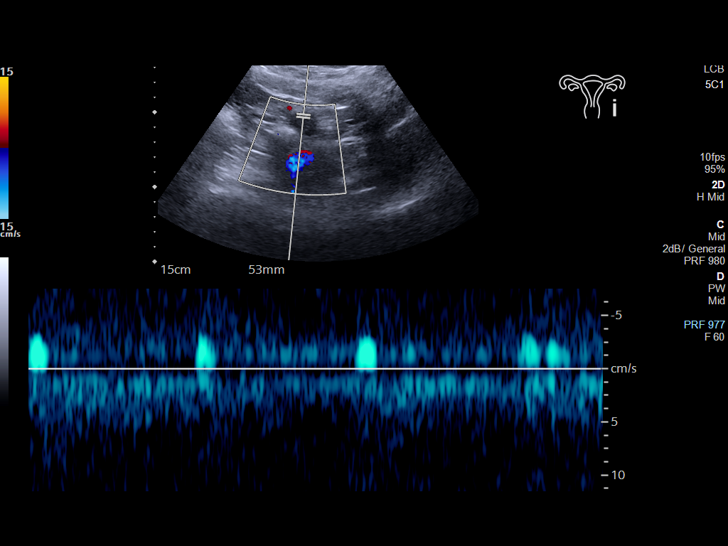
[im 40/68]
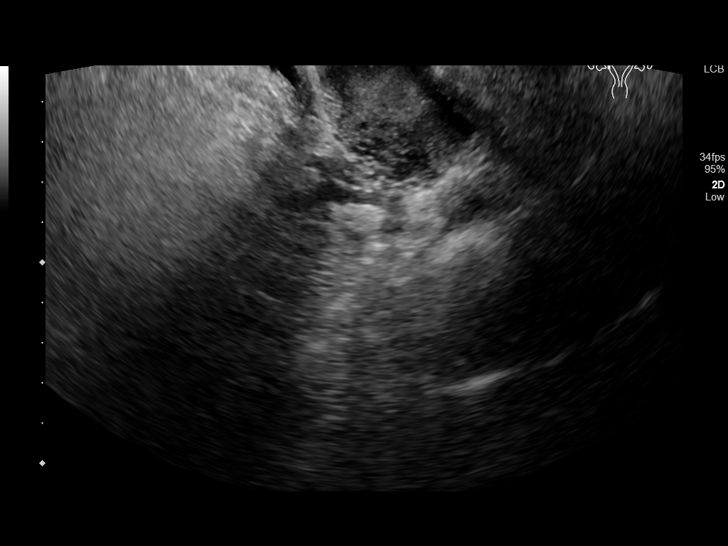
[im 45/68]
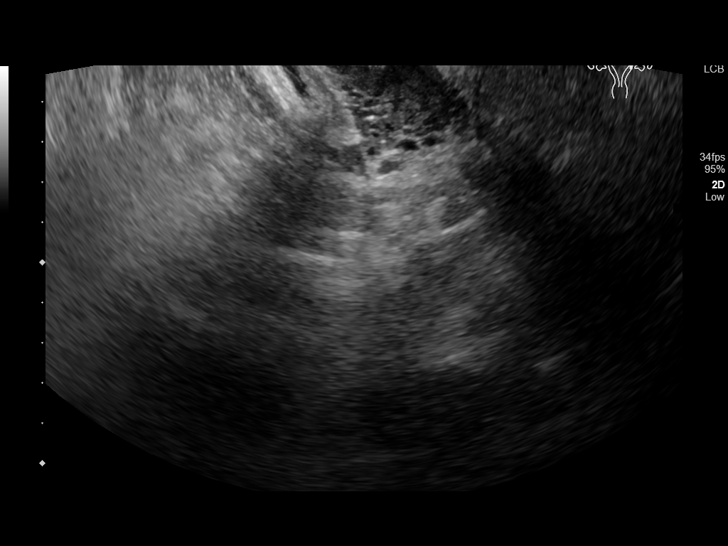
[im 51/68]
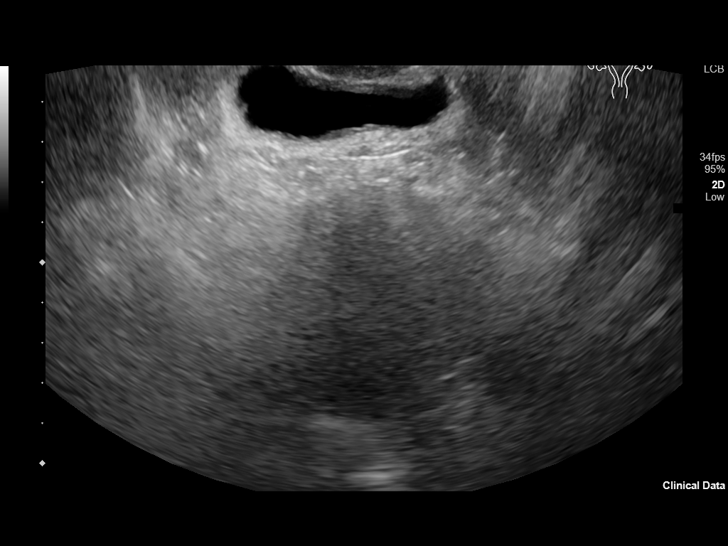
[im 56/68]
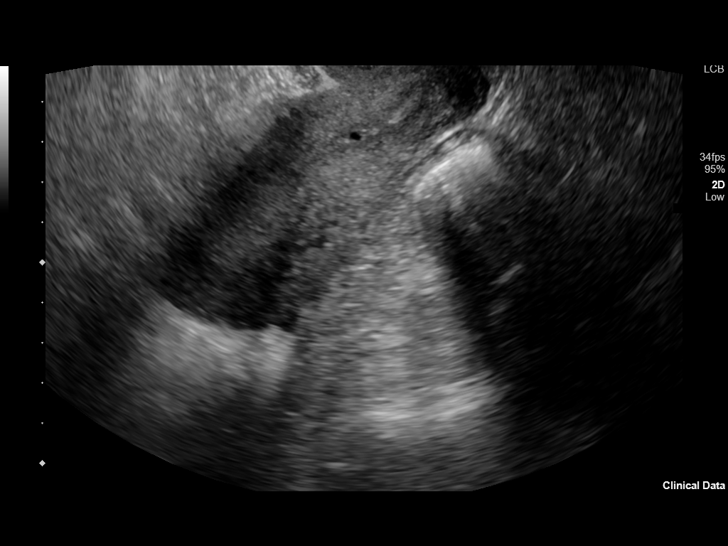
[im 62/68]
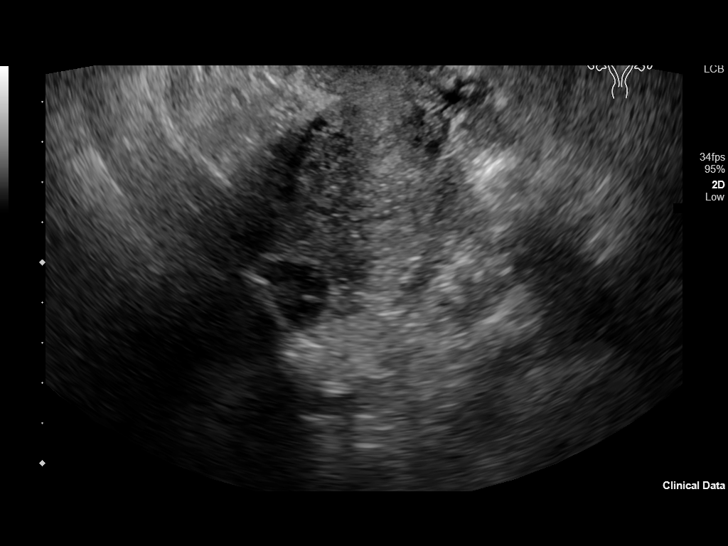
[im 68/68]
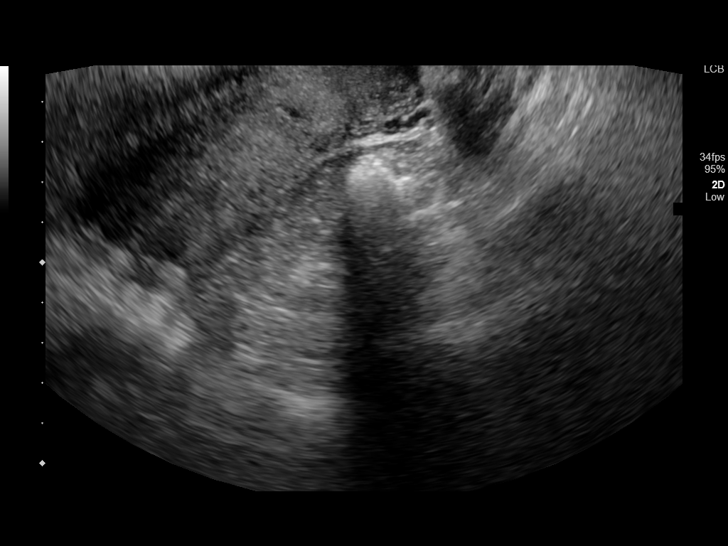

[13 of 25 positions shown; findings below may reference images not displayed]

FINDINGS: Uterus

Measurements: 9.4 by 3.8 by 5.0 cm = volume: 95 mL. No fibroids or
other mass visualized.

Endometrium

Thickness: 9 mm. A 3 mm fluid echogenicity structure along the
endometrium of the internal os of the cervix is probably a small
nabothian cyst or similar incidental finding.

Right ovary

Measurements: 1.9 by 1.9 by 1.5 cm = volume: 3.1 mL. Normal
appearance/no adnexal mass.

Left ovary

Measurements: 2.7 by 2.0 by 2.1 cm = volume: 6.1 mL. Normal
appearance/no adnexal mass.

Pulsed Doppler evaluation of both ovaries demonstrates normal
low-resistance arterial and venous waveforms.

Other findings

No abnormal free fluid.
IMPRESSION: 1. No significant sonographic abnormality of the uterus, ovaries, or
regional pelvis to explain the patient's symptoms.
